# Patient Record
Sex: Female | Born: 1955 | Race: Black or African American | Hispanic: No | Marital: Single | Smoking: Never smoker
Health system: Southern US, Community
[De-identification: ages and names within clinical notes are randomized; demographics above are authoritative.]

## PROBLEM LIST (undated history)

## (undated) DIAGNOSIS — G43909 Migraine, unspecified, not intractable, without status migrainosus: Secondary | ICD-10-CM

## (undated) DIAGNOSIS — F319 Bipolar disorder, unspecified: Secondary | ICD-10-CM

## (undated) DIAGNOSIS — S7291XA Unspecified fracture of right femur, initial encounter for closed fracture: Secondary | ICD-10-CM

## (undated) DIAGNOSIS — Z973 Presence of spectacles and contact lenses: Secondary | ICD-10-CM

## (undated) HISTORY — PX: ROTATOR CUFF REPAIR: SHX139

## (undated) HISTORY — PX: COLONOSCOPY: SHX174

## (undated) HISTORY — PX: ABDOMINAL HYSTERECTOMY: SHX81

---

## 1998-10-21 ENCOUNTER — Encounter: Payer: Self-pay | Admitting: Obstetrics and Gynecology

## 1998-10-21 ENCOUNTER — Ambulatory Visit (HOSPITAL_COMMUNITY): Admission: RE | Admit: 1998-10-21 | Discharge: 1998-10-21 | Payer: Self-pay | Admitting: Obstetrics and Gynecology

## 1999-10-31 ENCOUNTER — Encounter: Payer: Self-pay | Admitting: Obstetrics and Gynecology

## 1999-10-31 ENCOUNTER — Ambulatory Visit (HOSPITAL_COMMUNITY): Admission: RE | Admit: 1999-10-31 | Discharge: 1999-10-31 | Payer: Self-pay | Admitting: Obstetrics and Gynecology

## 1999-11-07 ENCOUNTER — Other Ambulatory Visit: Admission: RE | Admit: 1999-11-07 | Discharge: 1999-11-07 | Payer: Self-pay | Admitting: Obstetrics and Gynecology

## 2001-03-17 ENCOUNTER — Encounter: Payer: Self-pay | Admitting: Internal Medicine

## 2001-03-17 ENCOUNTER — Encounter: Admission: RE | Admit: 2001-03-17 | Discharge: 2001-03-17 | Payer: Self-pay | Admitting: Internal Medicine

## 2001-05-05 ENCOUNTER — Encounter: Payer: Self-pay | Admitting: Obstetrics and Gynecology

## 2001-05-05 ENCOUNTER — Ambulatory Visit (HOSPITAL_COMMUNITY): Admission: RE | Admit: 2001-05-05 | Discharge: 2001-05-05 | Payer: Self-pay | Admitting: Obstetrics and Gynecology

## 2001-10-04 ENCOUNTER — Ambulatory Visit (HOSPITAL_COMMUNITY): Admission: RE | Admit: 2001-10-04 | Discharge: 2001-10-04 | Payer: Self-pay | Admitting: Internal Medicine

## 2001-10-04 ENCOUNTER — Encounter: Payer: Self-pay | Admitting: Internal Medicine

## 2001-11-20 ENCOUNTER — Encounter: Payer: Self-pay | Admitting: Neurosurgery

## 2001-11-20 ENCOUNTER — Inpatient Hospital Stay (HOSPITAL_COMMUNITY): Admission: RE | Admit: 2001-11-20 | Discharge: 2001-11-21 | Payer: Self-pay | Admitting: Neurosurgery

## 2001-12-17 ENCOUNTER — Encounter: Payer: Self-pay | Admitting: Neurosurgery

## 2001-12-17 ENCOUNTER — Encounter: Admission: RE | Admit: 2001-12-17 | Discharge: 2001-12-17 | Payer: Self-pay | Admitting: Neurosurgery

## 2004-11-10 ENCOUNTER — Encounter: Admission: RE | Admit: 2004-11-10 | Discharge: 2004-11-10 | Payer: Self-pay | Admitting: Internal Medicine

## 2004-11-11 ENCOUNTER — Ambulatory Visit (HOSPITAL_COMMUNITY): Admission: AD | Admit: 2004-11-11 | Discharge: 2004-11-12 | Payer: Self-pay | Admitting: Orthopedic Surgery

## 2006-03-07 ENCOUNTER — Other Ambulatory Visit: Admission: RE | Admit: 2006-03-07 | Discharge: 2006-03-07 | Payer: Self-pay | Admitting: Obstetrics and Gynecology

## 2006-04-05 ENCOUNTER — Ambulatory Visit (HOSPITAL_COMMUNITY): Admission: RE | Admit: 2006-04-05 | Discharge: 2006-04-05 | Payer: Self-pay | Admitting: Obstetrics and Gynecology

## 2006-04-18 ENCOUNTER — Encounter: Admission: RE | Admit: 2006-04-18 | Discharge: 2006-04-18 | Payer: Self-pay | Admitting: Obstetrics and Gynecology

## 2006-05-23 ENCOUNTER — Encounter: Admission: RE | Admit: 2006-05-23 | Discharge: 2006-05-23 | Payer: Self-pay | Admitting: Internal Medicine

## 2007-11-26 ENCOUNTER — Emergency Department (HOSPITAL_COMMUNITY): Admission: EM | Admit: 2007-11-26 | Discharge: 2007-11-26 | Payer: Self-pay | Admitting: Emergency Medicine

## 2009-03-06 ENCOUNTER — Emergency Department (HOSPITAL_BASED_OUTPATIENT_CLINIC_OR_DEPARTMENT_OTHER): Admission: EM | Admit: 2009-03-06 | Discharge: 2009-03-06 | Payer: Self-pay | Admitting: Emergency Medicine

## 2010-09-18 ENCOUNTER — Emergency Department (HOSPITAL_COMMUNITY)
Admission: EM | Admit: 2010-09-18 | Discharge: 2010-09-19 | Payer: Self-pay | Source: Home / Self Care | Admitting: Emergency Medicine

## 2010-10-28 ENCOUNTER — Encounter: Payer: Self-pay | Admitting: Internal Medicine

## 2010-10-29 ENCOUNTER — Encounter: Payer: Self-pay | Admitting: Internal Medicine

## 2010-12-19 LAB — ETHANOL: Alcohol, Ethyl (B): <5 mg/dL (ref 0–10)

## 2010-12-19 LAB — COMPREHENSIVE METABOLIC PANEL
ALT: 14 U/L (ref 0–35)
AST: 17 U/L (ref 0–37)
Calcium: 9.2 mg/dL (ref 8.4–10.5)
Creatinine, Ser: 0.75 mg/dL (ref 0.4–1.2)
GFR calc Af Amer: >60 mL/min (ref >60)
GFR calc non Af Amer: >60 mL/min (ref >60)
Sodium: 139 mEq/L (ref 135–145)
Total Protein: 7.1 g/dL (ref 6.0–8.3)

## 2010-12-19 LAB — URINALYSIS, ROUTINE W REFLEX MICROSCOPIC
Bilirubin Urine: NEGATIVE
Glucose, UA: NEGATIVE mg/dL
Ketones, ur: NEGATIVE mg/dL
Protein, ur: NEGATIVE mg/dL
Urobilinogen, UA: 0.2 mg/dL (ref 0.0–1.0)

## 2010-12-19 LAB — CBC
MCH: 30.9 pg (ref 26.0–34.0)
MCHC: 34.3 g/dL (ref 30.0–36.0)
Platelets: 254 10*3/uL (ref 150–400)
RDW: 13 % (ref 11.5–15.5)

## 2010-12-19 LAB — DIFFERENTIAL
Eosinophils Absolute: 0.1 10*3/uL (ref 0.0–0.7)
Eosinophils Relative: 2 % (ref 0–5)
Lymphocytes Relative: 59 % — ABNORMAL HIGH (ref 12–46)
Lymphs Abs: 3.4 10*3/uL (ref 0.7–4.0)
Monocytes Relative: 6 % (ref 3–12)

## 2010-12-19 LAB — RAPID URINE DRUG SCREEN, HOSP PERFORMED
Amphetamines: NOT DETECTED
Benzodiazepines: NOT DETECTED
Cocaine: NOT DETECTED
Opiates: NOT DETECTED
Tetrahydrocannabinol: POSITIVE — AB

## 2010-12-19 LAB — URINE MICROSCOPIC-ADD ON

## 2011-01-16 LAB — COMPREHENSIVE METABOLIC PANEL
ALT: 16 U/L (ref 0–35)
AST: 25 U/L (ref 0–37)
Albumin: 4.6 g/dL (ref 3.5–5.2)
Alkaline Phosphatase: 85 U/L (ref 39–117)
BUN: 11 mg/dL (ref 6–23)
CO2: 27 mEq/L (ref 19–32)
Calcium: 9.6 mg/dL (ref 8.4–10.5)
Chloride: 108 mEq/L (ref 96–112)
Creatinine, Ser: 0.9 mg/dL (ref 0.4–1.2)
GFR calc Af Amer: >60 mL/min (ref >60)
GFR calc non Af Amer: >60 mL/min (ref >60)
Glucose, Bld: 83 mg/dL (ref 70–99)
Potassium: 3.9 mEq/L (ref 3.5–5.1)
Sodium: 144 mEq/L (ref 135–145)
Total Bilirubin: 0.2 mg/dL — ABNORMAL LOW (ref 0.3–1.2)
Total Protein: 8.1 g/dL (ref 6.0–8.3)

## 2011-01-16 LAB — CBC
HCT: 38.1 % (ref 36.0–46.0)
Hemoglobin: 12.8 g/dL (ref 12.0–15.0)
MCHC: 33.7 g/dL (ref 30.0–36.0)
MCV: 95.1 fL (ref 78.0–100.0)
Platelets: 230 10*3/uL (ref 150–400)
RBC: 4 MIL/uL (ref 3.87–5.11)
RDW: 12.3 % (ref 11.5–15.5)
WBC: 6 10*3/uL (ref 4.0–10.5)

## 2011-01-16 LAB — DIFFERENTIAL
Basophils Absolute: 0.1 10*3/uL (ref 0.0–0.1)
Basophils Relative: 2 % — ABNORMAL HIGH (ref 0–1)
Eosinophils Absolute: 0.1 10*3/uL (ref 0.0–0.7)
Eosinophils Relative: 1 % (ref 0–5)
Lymphocytes Relative: 36 % (ref 12–46)
Lymphs Abs: 2.1 10*3/uL (ref 0.7–4.0)
Monocytes Absolute: 0.3 10*3/uL (ref 0.1–1.0)
Monocytes Relative: 5 % (ref 3–12)
Neutro Abs: 3.4 10*3/uL (ref 1.7–7.7)
Neutrophils Relative %: 56 % (ref 43–77)

## 2011-01-16 LAB — POCT TOXICOLOGY PANEL
Barbiturates: POSITIVE
Tetrahydrocannabinol: POSITIVE

## 2011-01-16 LAB — ACETAMINOPHEN LEVEL: Acetaminophen (Tylenol), Serum: <10 ug/mL — ABNORMAL LOW (ref 10–30)

## 2011-01-16 LAB — SALICYLATE LEVEL: Salicylate Lvl: 1 mg/dL — ABNORMAL LOW (ref 2.8–20.0)

## 2011-01-16 LAB — ETHANOL: Alcohol, Ethyl (B): <5 mg/dL (ref 0–10)

## 2011-02-23 NOTE — Discharge Summary (Signed)
[place]. [facility]  Patient:    [name], Sandra Warren Visit Number: [id number] MRN: [id number]          Service Type: SUR Location: 3000 3022 01 Attending Physician:  Jackelyn Knife Dictated by:   Izell [place] Elesa Hacker, M.D. Admit Date:  11/20/2001 Discharge Date: 11/21/2001   CC:         Antony Madura, M.D.   Discharge Summary  SUMMARY:  Mrs. Sandra Warren is a generally healthy 55 year old lady who presented with signs and symptoms of a herniated intervertebral disk in her lower cervical area at C6-7 associated with left arm pain and weakness.  This diagnosis was confirmed on MRI.  She was followed conservatively for awhile but did not improve and was admitted on 11/20/01 for surgical decompression.  On the day of admission, she was taken to the operating room and underwent an anterior cervical diskectomy and fusion at C6-7.  This was done with autograft bone and the BAK/C cage.  She tolerated the procedure well and was able to be discharged the following day.  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  CONDITION AT DISCHARGE:  Improved.  Her incision was healing well and her arm pain was much better.  INSTRUCTIONS TO PATIENT: 1. Limited activity with 10-pound weight lifting restriction for four weeks. 2. Regular diet. 3. Back to see me in about 10 days. 4. Discharge medications: Vicodin. 5. She is to wear a soft cervical collar at all times when up and also at    night. Dictated by:   Izell [place] Elesa Hacker, M.D. Attending Physician:  Jackelyn Knife DD:  11/21/01 TD:  11/21/01 Job: [id number]

## 2011-02-23 NOTE — Op Note (Signed)
[place]. [facility]  Patient:    Sandra Warren, Sandra Warren Visit Number: [id number] MRN: [id number]          Service Type: SUR Location: 3000 3022 01 Attending Physician:  Jackelyn Knife Dictated by:   Izell [place] Elesa Hacker, M.D. Proc. Date: 11/20/01 Admit Date:  11/20/2001 Discharge Date: 11/21/2001                             Operative Report  PREOPERATIVE DIAGNOSIS:  Herniated cervical disk, C6-7.  POSTOPERATIVE DIAGNOSIS:  Herniated cervical disk, C6-7.  OPERATION PERFORMED:  Anterior cervical diskectomy utilizing the BAK-C intervertebral cage with autograft.  SURGEON:  Izell [place]. Elesa Hacker, M.D.  ASSISTANT:  Clydene Fake, M.D.  DESCRIPTION OF PROCEDURE:  Under general endotracheal anesthesia, the patient was supine on the operating room table with the head held in halter traction. The patient was prepped and draped in the usual manner, and an incision was made on the left side of the neck extending from the midline, obliquely to the anterior border of the sternocleidomastoid, situated in such a way as to allow access to the C6-7 intervertebral space.  Soft tissue dissection was carried down to the anterior vertebral border, and an intraoperative x-ray was taken to positively identify the proper operative level.  Self-retaining retractors were put into place, and the C6-7 disk was excised by means of removal of the anterior portion of the annulus, and then removal of soft disk tissue using curettes, rongeurs, and eventually the high speed drill.  The operating microscope was utilized for this part of the procedure.  Once the interspace was clear and all of the offending herniated disk material had been removed, the BAK-C cage instrumentation was brought into place.  The interspace was properly sized and prepared with the instrumentation.  A cage was then screwed into place under fluoroscopic control.  After proper placement of the cage was confirmed  radiographically, the wound was irrigated and then closed in layers.  A sterile dressing was applied, as well as a soft collar, and the patient was taken to the recovery room in good condition. Dictated by:   Izell [place] Elesa Hacker, M.D. Attending Physician:  Jackelyn Knife DD:  12/04/01 TD:  12/04/01 Job: [id number]

## 2011-02-23 NOTE — Op Note (Signed)
Sandra Warren, Sandra Warren              ACCOUNT NO.:  [id number]   MEDICAL RECORD NO.:  [id number]          PATIENT TYPE:  OIB   LOCATION:  2899                         FACILITY:  [facility]   PHYSICIAN:  John L. Rendall III, M.D.DATE OF BIRTH:  1955-12-23   DATE OF PROCEDURE:  DATE OF DISCHARGE:                                 OPERATIVE REPORT   DATE OF OPERATION:  November 11, 2004.   PREOPERATIVE DIAGNOSIS:  Totally displaced two-part surgical neck fracture,  right humerus.   POSTOPERATIVE DIAGNOSIS:  Totally displaced two-part surgical neck fracture,  right humerus.   SURGICAL PROCEDURE:  Closed manipulation and percutaneous pinning, two-part  surgical neck fracture.   SURGEON:  John L. Rendall, MD.   ASSISTANTRexene Edison, PA-C.   ANESTHESIA:  General.   PATHOLOGY:  The patient is three days post fracture of the surgical neck of  the humerus with the shaft displaced anteriorly into the axillary region.  She just came to our office 12 hours earlier, and the fracture was scheduled  for manipulation and procedure.   PROCEDURE:  Under general anesthesia, the right shoulder was manipulated  with a blanket in the armpit and distal traction in an abducted forward  flexed position.  Once disimpaction had occurred, the humeral neck of the  distal fragment was brought out of the axillary region, and the bones could  be felt to contact each other.  AP views revealed what appeared to be good  position.  The shoulder was rotated slightly and some malalignment was felt  to be present.  Decision was made to do another sort of mini-manipulation,  and excellent position and alignment were obtained.  Using 2.5 mm distally  threaded Synthes K-wires, two wires were placed from the anterior humeral  neck up into the humeral head, and two were placed from the humeral into the  medial shaft.  With the C-arm to confirm good position on 90 degree internal  and external rotation pictures, the case was  over.  The Steinmann pins were  bent just an inch from the skin and pin caps applied after cutting the  wires.  A sterile dressing and arm sling were applied.  The patient returned  to recovery in good condition.  Surgical time 30 minutes.      JLR/MEDQ  D:  11/11/2004  T:  11/12/2004  Job:  [id number]

## 2011-02-23 NOTE — H&P (Signed)
[place]. [facility]  Patient:    Sandra Warren, Sandra Warren Visit Number: [id number] MRN: [id number]          Service Type: SUR Location: 3000 3022 01 Attending Physician:  Jackelyn Knife Dictated by:   Izell [place] Elesa Hacker, M.D. Admit Date:  11/20/2001                           History and Physical  HISTORY OF PRESENT ILLNESS:  Sandra Warren is a 55 year old lady who was seen initially on October 06, 2001 through the courtesy of Dr. Antony Warren. At that time, she had a chief complaint of pain in the upper portion of the back on the left side and had noted radiation of pain across her left scapula and on down into the left arm.  Sandra Warren felt that this started on October 02, 2001 when she fell at home and ended up falling down 14 carpeted steps when she tripped over her cap.  She is left-handed and this has increased the amount of difficulty she has had.  She has had no trouble with her legs, bowel, or bladder control.  She was seen October 06, 2001, and at that time, had triceps weakness on the left side, along with some minimal sensory deficits over C7.  At the time of the initial visit, a careful discussion was carried out with her and her options were noted to include surgical treatment by means of an anterior cervical discectomy fusion or a conservative follow-up at least over the next few weeks.  She elected to go conservatively and this was done.  By late January, she was no better and indeed seemed to be getting worse, so plans were made for surgical treatment by means of an anterior cervical discectomy fusion.  PAST MEDICAL HISTORY:  Pertinent in that she is medically retired for psychiatric reasons and she tells me that she has anxiety and panic attacks. She is currently under treatment for this.  FAMILY HISTORY:  Mother living at age 32 and her father 80, living and well. She has a 59 year old sister living and well.  She has no  children.  PAST SURGICAL HISTORY:  She had a hysterectomy in 1993.  CURRENT MEDICATIONS:  She is currently taking no medication.  ALLERGIES:  No medical allergies.  SOCIAL HISTORY:  She does not smoke or use alcohol.  She is medically retired as noted.  REVIEW OF SYSTEMS:  Negative.  PHYSICAL EXAMINATION:  GENERAL:  She is alert and cooperative.  Her affect is somewhat different and she occasionally has some difficulty concentrating on the issues at hand.  She is oriented to person, place, and time.  HEENT:  Normal.  CHEST:  Clear.  CARDIOVASCULAR:  No bruits.  BREASTS:  A detailed breast examination was not done.  ABDOMEN:  Soft and nontender with no palpable masses.  NEUROLOGICAL:  Sensorium as noted.  The gait is probably normal but she does move very cautiously and slowly and holds her head and neck completely stiff and also does not wish to move her left arm.  Motor examination reveals definite and reproducible weakness of the left triceps.  There is diminished sensation of the index finger primarily of her left hand and some slight involvement over the third finger.  Right-sided strength and sensation are normal.  Deep tendon reflexes are 1 to 2+ and symmetrical, except for a clearly diminished left triceps reflex.  Her pain  could be worsened with rotation of the head and neck to the left and with hyperextension.  IMPRESSION:  Herniated cervical disc at cervical vertebrae-6 and cervical vertebrae-7, central and on the left side.  RECOMMENDATIONS:  Recommendation for surgical decompression by means of an anterior cervical discectomy fusion.  She understands the risks including the risks of damage to the carotid artery, trachea, and esophagus.  She understands there could be damage to nerve root and spinal cord with the result of loss of motor and sensory function.  She understands that there is the risks of bleeding or infection as there is with any  surgery. Dictated by:   Izell [place] Elesa Hacker, M.D. Attending Physician:  Jackelyn Knife DD:  11/19/01 TD:  11/19/01 Job: [id number]

## 2011-07-02 LAB — URINALYSIS, ROUTINE W REFLEX MICROSCOPIC
Bilirubin Urine: NEGATIVE
Glucose, UA: NEGATIVE
Ketones, ur: NEGATIVE
Leukocytes, UA: NEGATIVE
Nitrite: NEGATIVE
Protein, ur: 100 — AB
Specific Gravity, Urine: 1.024
Urobilinogen, UA: 0.2
pH: 5

## 2011-07-02 LAB — CBC
HCT: 38
Hemoglobin: 12.7
MCHC: 33.5
MCV: 93.9
Platelets: 211
RBC: 4.05
RDW: 12.8
WBC: 7.5

## 2011-07-02 LAB — URINE MICROSCOPIC-ADD ON

## 2011-07-02 LAB — DIFFERENTIAL
Basophils Absolute: 0
Basophils Relative: 0
Eosinophils Absolute: 0
Eosinophils Relative: 0
Lymphocytes Relative: 15
Lymphs Abs: 1.1
Monocytes Absolute: 0.3
Monocytes Relative: 4
Neutro Abs: 6
Neutrophils Relative %: 81 — ABNORMAL HIGH

## 2011-07-02 LAB — COMPREHENSIVE METABOLIC PANEL
ALT: 20
AST: 23
Albumin: 4.1
Alkaline Phosphatase: 55
BUN: 8
CO2: 23
Calcium: 9
Chloride: 108
Creatinine, Ser: 0.68
GFR calc Af Amer: >60
GFR calc non Af Amer: >60
Glucose, Bld: 83
Potassium: 3.6
Sodium: 138
Total Bilirubin: 0.3
Total Protein: 7.2

## 2011-07-02 LAB — URINE CULTURE
Colony Count: NO GROWTH
Culture: NO GROWTH

## 2011-07-02 LAB — RAPID URINE DRUG SCREEN, HOSP PERFORMED
Amphetamines: NOT DETECTED
Barbiturates: POSITIVE — AB
Benzodiazepines: NOT DETECTED
Cocaine: NOT DETECTED
Opiates: NOT DETECTED
Tetrahydrocannabinol: POSITIVE — AB

## 2011-07-02 LAB — ETHANOL: Alcohol, Ethyl (B): 5

## 2015-10-20 ENCOUNTER — Other Ambulatory Visit: Payer: Self-pay | Admitting: Internal Medicine

## 2015-10-20 ENCOUNTER — Ambulatory Visit
Admission: RE | Admit: 2015-10-20 | Discharge: 2015-10-20 | Disposition: A | Discharge status: Home / Self Care | Payer: Medicare Other | Source: Ambulatory Visit | Attending: Internal Medicine | Admitting: Internal Medicine

## 2015-10-20 DIAGNOSIS — R52 Pain, unspecified: Secondary | ICD-10-CM

## 2016-09-19 ENCOUNTER — Ambulatory Visit
Admission: RE | Admit: 2016-09-19 | Discharge: 2016-09-19 | Disposition: A | Discharge status: Home / Self Care | Payer: Medicare Other | Source: Ambulatory Visit | Attending: Internal Medicine | Admitting: Internal Medicine

## 2016-09-19 ENCOUNTER — Other Ambulatory Visit: Payer: Self-pay | Admitting: Internal Medicine

## 2016-09-19 DIAGNOSIS — T1490XA Injury, unspecified, initial encounter: Secondary | ICD-10-CM

## 2017-10-15 ENCOUNTER — Other Ambulatory Visit: Payer: Self-pay | Admitting: Internal Medicine

## 2017-10-15 DIAGNOSIS — R410 Disorientation, unspecified: Secondary | ICD-10-CM

## 2017-10-15 DIAGNOSIS — R413 Other amnesia: Secondary | ICD-10-CM

## 2017-10-15 DIAGNOSIS — S0990XA Unspecified injury of head, initial encounter: Secondary | ICD-10-CM

## 2017-10-18 ENCOUNTER — Ambulatory Visit
Admission: RE | Admit: 2017-10-18 | Discharge: 2017-10-18 | Disposition: A | Discharge status: Home / Self Care | Payer: Medicare Other | Source: Ambulatory Visit | Attending: Internal Medicine | Admitting: Internal Medicine

## 2017-10-18 DIAGNOSIS — R413 Other amnesia: Secondary | ICD-10-CM

## 2017-10-18 DIAGNOSIS — R410 Disorientation, unspecified: Secondary | ICD-10-CM

## 2017-10-18 DIAGNOSIS — S0990XA Unspecified injury of head, initial encounter: Secondary | ICD-10-CM

## 2017-11-12 ENCOUNTER — Other Ambulatory Visit: Payer: Self-pay | Admitting: Internal Medicine

## 2017-11-12 ENCOUNTER — Ambulatory Visit
Admission: RE | Admit: 2017-11-12 | Discharge: 2017-11-12 | Disposition: A | Discharge status: Home / Self Care | Payer: Medicare Other | Source: Ambulatory Visit | Attending: Internal Medicine | Admitting: Internal Medicine

## 2017-11-12 DIAGNOSIS — S93402A Sprain of unspecified ligament of left ankle, initial encounter: Secondary | ICD-10-CM

## 2019-06-16 ENCOUNTER — Emergency Department (HOSPITAL_COMMUNITY)
Admission: EM | Admit: 2019-06-16 | Discharge: 2019-06-16 | Discharge status: Against Medical Advice | Payer: Medicare Other | Attending: Emergency Medicine | Admitting: Emergency Medicine

## 2019-06-16 ENCOUNTER — Other Ambulatory Visit: Payer: Self-pay

## 2019-06-16 ENCOUNTER — Encounter (HOSPITAL_COMMUNITY): Payer: Self-pay

## 2019-06-16 DIAGNOSIS — Z5321 Procedure and treatment not carried out due to patient leaving prior to being seen by health care provider: Secondary | ICD-10-CM | POA: Diagnosis not present

## 2019-06-16 DIAGNOSIS — R51 Headache: Secondary | ICD-10-CM | POA: Diagnosis present

## 2019-06-16 HISTORY — DX: Migraine, unspecified, not intractable, without status migrainosus: G43.909

## 2019-06-16 NOTE — ED Triage Notes (Signed)
Patient states she hit her head on a door jamb 3 months ago and states she slipped down 18 steps 2 months ago. Patient states she began feeling like pins sticking her in her head the day she slipped down the stairs. Patient states she feels like something is not right in her head. Patient states, "I feel like I need an MRI."

## 2020-01-28 ENCOUNTER — Encounter (HOSPITAL_COMMUNITY): Payer: Self-pay | Admitting: Emergency Medicine

## 2020-01-28 ENCOUNTER — Other Ambulatory Visit: Payer: Self-pay

## 2020-01-28 ENCOUNTER — Emergency Department (HOSPITAL_COMMUNITY)
Admission: EM | Admit: 2020-01-28 | Discharge: 2020-01-29 | Disposition: A | Discharge status: Home / Self Care | Payer: Medicare Other | Attending: Emergency Medicine | Admitting: Emergency Medicine

## 2020-01-28 DIAGNOSIS — Z5321 Procedure and treatment not carried out due to patient leaving prior to being seen by health care provider: Secondary | ICD-10-CM | POA: Insufficient documentation

## 2020-01-28 DIAGNOSIS — R519 Headache, unspecified: Secondary | ICD-10-CM | POA: Insufficient documentation

## 2020-01-28 LAB — COMPREHENSIVE METABOLIC PANEL
ALT: 10 U/L (ref 0–44)
AST: 13 U/L — ABNORMAL LOW (ref 15–41)
Albumin: 3.7 g/dL (ref 3.5–5.0)
Alkaline Phosphatase: 54 U/L (ref 38–126)
Anion gap: 8 (ref 5–15)
BUN: 6 mg/dL — ABNORMAL LOW (ref 8–23)
CO2: 22 mmol/L (ref 22–32)
Calcium: 8.7 mg/dL — ABNORMAL LOW (ref 8.9–10.3)
Chloride: 107 mmol/L (ref 98–111)
Creatinine, Ser: 0.71 mg/dL (ref 0.44–1.00)
GFR calc Af Amer: >60 mL/min (ref >60)
GFR calc non Af Amer: >60 mL/min (ref >60)
Glucose, Bld: 84 mg/dL (ref 70–99)
Potassium: 3.4 mmol/L — ABNORMAL LOW (ref 3.5–5.1)
Sodium: 137 mmol/L (ref 135–145)
Total Bilirubin: 0.3 mg/dL (ref 0.3–1.2)
Total Protein: 6.7 g/dL (ref 6.5–8.1)

## 2020-01-28 LAB — CBC WITH DIFFERENTIAL/PLATELET
Abs Immature Granulocytes: 0.02 10*3/uL (ref 0.00–0.07)
Basophils Absolute: 0 10*3/uL (ref 0.0–0.1)
Basophils Relative: 1 %
Eosinophils Absolute: 0.1 10*3/uL (ref 0.0–0.5)
Eosinophils Relative: 2 %
HCT: 37.3 % (ref 36.0–46.0)
Hemoglobin: 12.4 g/dL (ref 12.0–15.0)
Immature Granulocytes: 1 %
Lymphocytes Relative: 52 %
Lymphs Abs: 2.1 10*3/uL (ref 0.7–4.0)
MCH: 31.2 pg (ref 26.0–34.0)
MCHC: 33.2 g/dL (ref 30.0–36.0)
MCV: 93.7 fL (ref 80.0–100.0)
Monocytes Absolute: 0.4 10*3/uL (ref 0.1–1.0)
Monocytes Relative: 9 %
Neutro Abs: 1.4 10*3/uL — ABNORMAL LOW (ref 1.7–7.7)
Neutrophils Relative %: 35 %
Platelets: 245 10*3/uL (ref 150–400)
RBC: 3.98 MIL/uL (ref 3.87–5.11)
RDW: 12.5 % (ref 11.5–15.5)
WBC: 4 10*3/uL (ref 4.0–10.5)
nRBC: 0 % (ref 0.0–0.2)

## 2020-01-28 NOTE — ED Triage Notes (Addendum)
Pt st's she slipped and fell down 13 steps 3 weeks ago then she slipped in oil and fell again.  Pt denies head pain but st's she has bumps on her head.  Pt st's her head just doesn't feel right

## 2020-01-28 NOTE — ED Notes (Signed)
Pt stated to take her out of the system because she would be leaving.

## 2020-03-22 ENCOUNTER — Other Ambulatory Visit: Payer: Self-pay

## 2020-03-22 ENCOUNTER — Other Ambulatory Visit: Payer: Self-pay | Admitting: Orthopedic Surgery

## 2020-03-22 ENCOUNTER — Ambulatory Visit
Admission: RE | Admit: 2020-03-22 | Discharge: 2020-03-22 | Disposition: A | Discharge status: Home / Self Care | Payer: Medicare Other | Source: Ambulatory Visit | Attending: Orthopedic Surgery | Admitting: Orthopedic Surgery

## 2020-03-22 ENCOUNTER — Encounter (HOSPITAL_COMMUNITY): Payer: Self-pay | Admitting: Student

## 2020-03-22 ENCOUNTER — Ambulatory Visit: Payer: Self-pay | Admitting: Student

## 2020-03-22 DIAGNOSIS — M25561 Pain in right knee: Secondary | ICD-10-CM

## 2020-03-22 DIAGNOSIS — S72461A Displaced supracondylar fracture with intracondylar extension of lower end of right femur, initial encounter for closed fracture: Secondary | ICD-10-CM | POA: Insufficient documentation

## 2020-03-22 NOTE — Progress Notes (Signed)
Pt denies SOB, chest pain, and being under the care of a cardiologist. Pt stated that PCP is Dr. Su Hilt. Pt denies having a stress test, echo and cardiac cath. Pt denies having an EKG and chest x ray. Pt denies recent labs. Pt made aware to stop taking Aspirin (unless otherwise advised by surgeon), vitamins, fish oil and herbal medications. Do not take any NSAIDs ie: Ibuprofen, Advil, Naproxen (Aleve), Motrin, BC and Goody Powder. Pt reminded to quarantine. Pt verbalized understanding of all pre-op instructions.

## 2020-03-22 NOTE — H&P (Signed)
Orthopaedic Trauma Service (OTS) H&P  Patient ID: Sandra Warren MRN: [id number] DOB/AGE: 12-Nov-1955 64 y.o.  Reason for surgery: Right distal femur fracture  HPI: Sandra Warren is an 64 y.o. female with no significant past medical history who presents for surgery on her right lower extremity.  Patient slipped and fell in her kitchen on 03/15/2020.  She had immediate pain and swelling in her right knee.  She did not seek immediate medical attention at the time of initial injury.  1 week after initial injury patient symptoms were not improving and she was still unable to weight-bear on the right lower extremity.  At this point she was evaluated at [facility] orthopedics.  Imaging revealed a displaced right distal femur fracture.  Patient was referred to orthopedic trauma service to discuss surgical management.  Patient last seen in OTS clinic on 03/22/2020.  Continues have significant pain and swelling of the right knee.  Significant pain with any range of motion of the knee.  Denies any other injuries sustained during her fall.  Denies any significant numbness or tingling through the right lower extremity.  Denies any previous injury or surgery to the right lower extremity.  Patient with no other medical issues.  On no anticoagulation.  Patient very active at baseline and ambulates without any assistive device.  Patient does not currently work.  Home independently.  Has no steps to enter her home  Past Medical History:  Diagnosis Date  . Femur fracture, right (HCC)    distal  . Migraine     Past Surgical History:  Procedure Laterality Date  . ABDOMINAL HYSTERECTOMY    . ROTATOR CUFF REPAIR      Family History  Problem Relation Age of Onset  . Hypertension Mother   . Hypertension Father   . Hypertension Sister     Social History:  reports that she has never smoked. She has never used smokeless tobacco. She reports that she does not drink alcohol and does not use  drugs.  Allergies:  Allergies  Allergen Reactions  . Cheese   . Peanut-Containing Drug Products     Makes pt feel weird     Medications:  Prior to Admission:  No medications prior to admission.    ROS: Constitutional: No fever or chills Vision: No changes in vision ENT: No difficulty swallowing CV: No chest pain Pulm: No SOB or wheezing GI: No nausea or vomiting GU: No urgency or inability to hold urine Skin: No poor wound healing Neurologic: No numbness or tingling Psychiatric: No depression or anxiety Heme: No bruising Allergic: No reaction to medications or food   Exam: There were no vitals taken for this visit. General: Thin female, appears her stated age.  No acute distress Orientation: Alert and oriented x3 Mood and Affect: Mood and affect appropriate.  Pleasant and cooperative Gait: Not assessed due to known fracture Coordination and balance: Within normal limits  Right lower extremity: Skin with mild ecchymosis over the knee.  Moderate knee effusion.  Tenderness with palpation over the distal femur.  Nontender to the lower leg, ankle, foot.  Ankle dorsiflexion and plantarflexion is intact.  Knee motion not tolerated secondary to pain.  Endorses sensation to light touch distally.  Compartments are soft and compressible.  Able to wiggle each of her toes.  2+ DP pulse  Left lower extremity: Skin without lesions. No tenderness to palpation. Full painless ROM, full strength in each muscle groups without evidence of instability.   Medical Decision  Making: Data: Imaging: X-ray and CT scan of right knee reveals comminuted, displaced distal femur fracture  Labs: No results found for this or any previous visit (from the past 168 hour(s)).    Assessment/Plan: 64 year old female status post fall, resulting in right displaced distal femur fracture.  Patient with significant injury to right knee that will require surgical fixation.  Without surgical intervention patient  is likely to go on to have significant posttraumatic arthritis of the right knee.  Patient is an extremely active individual and therefore I recommend proceeding with open reduction internal fixation of the right distal femur.  Operative versus nonoperative management of her injury including risks and benefits of each were discussed with the patient and her family members.  Patient would like to proceed with surgical fixation.  Consent will be obtained.  We will plan to admit patient postoperatively for observation, pain control, and for patient to mobilize with therapies.  Karsyn Rochin A. [facility] [phone] (office) [email or web address]

## 2020-03-23 ENCOUNTER — Observation Stay (HOSPITAL_COMMUNITY): Payer: Medicare Other

## 2020-03-23 ENCOUNTER — Encounter (HOSPITAL_COMMUNITY): Payer: Self-pay | Admitting: Student

## 2020-03-23 ENCOUNTER — Inpatient Hospital Stay (HOSPITAL_COMMUNITY): Payer: Medicare Other | Admitting: Certified Registered Nurse Anesthetist

## 2020-03-23 ENCOUNTER — Encounter (HOSPITAL_COMMUNITY)
Admission: RE | Disposition: A | Discharge status: Home / Self Care | Payer: Self-pay | Source: Home / Self Care | Attending: Student

## 2020-03-23 ENCOUNTER — Inpatient Hospital Stay (HOSPITAL_COMMUNITY): Payer: Medicare Other

## 2020-03-23 ENCOUNTER — Other Ambulatory Visit: Payer: Self-pay

## 2020-03-23 ENCOUNTER — Observation Stay (HOSPITAL_COMMUNITY)
Admission: RE | Admit: 2020-03-23 | Discharge: 2020-03-24 | Disposition: A | Discharge status: Home / Self Care | Payer: Medicare Other | Attending: Student | Admitting: Student

## 2020-03-23 DIAGNOSIS — R2681 Unsteadiness on feet: Secondary | ICD-10-CM | POA: Insufficient documentation

## 2020-03-23 DIAGNOSIS — Z20822 Contact with and (suspected) exposure to covid-19: Secondary | ICD-10-CM | POA: Diagnosis not present

## 2020-03-23 DIAGNOSIS — Z9181 History of falling: Secondary | ICD-10-CM | POA: Insufficient documentation

## 2020-03-23 DIAGNOSIS — S72401A Unspecified fracture of lower end of right femur, initial encounter for closed fracture: Secondary | ICD-10-CM | POA: Diagnosis present

## 2020-03-23 DIAGNOSIS — S72461A Displaced supracondylar fracture with intracondylar extension of lower end of right femur, initial encounter for closed fracture: Principal | ICD-10-CM | POA: Insufficient documentation

## 2020-03-23 DIAGNOSIS — Y92010 Kitchen of single-family (private) house as the place of occurrence of the external cause: Secondary | ICD-10-CM | POA: Diagnosis not present

## 2020-03-23 DIAGNOSIS — W1830XA Fall on same level, unspecified, initial encounter: Secondary | ICD-10-CM | POA: Diagnosis not present

## 2020-03-23 DIAGNOSIS — Z419 Encounter for procedure for purposes other than remedying health state, unspecified: Secondary | ICD-10-CM

## 2020-03-23 DIAGNOSIS — Z91010 Allergy to peanuts: Secondary | ICD-10-CM | POA: Insufficient documentation

## 2020-03-23 DIAGNOSIS — T148XXA Other injury of unspecified body region, initial encounter: Secondary | ICD-10-CM

## 2020-03-23 HISTORY — DX: Bipolar disorder, unspecified: F31.9

## 2020-03-23 HISTORY — DX: Presence of spectacles and contact lenses: Z97.3

## 2020-03-23 HISTORY — DX: Unspecified fracture of right femur, initial encounter for closed fracture: S72.91XA

## 2020-03-23 HISTORY — PX: ORIF FEMUR FRACTURE: SHX2119

## 2020-03-23 LAB — CBC
HCT: 26 % — ABNORMAL LOW (ref 36.0–46.0)
Hemoglobin: 8.7 g/dL — ABNORMAL LOW (ref 12.0–15.0)
MCH: 31.6 pg (ref 26.0–34.0)
MCHC: 33.5 g/dL (ref 30.0–36.0)
MCV: 94.5 fL (ref 80.0–100.0)
Platelets: 370 10*3/uL (ref 150–400)
RBC: 2.75 MIL/uL — ABNORMAL LOW (ref 3.87–5.11)
RDW: 13.1 % (ref 11.5–15.5)
WBC: 7.3 10*3/uL (ref 4.0–10.5)
nRBC: 0 % (ref 0.0–0.2)

## 2020-03-23 LAB — VITAMIN D 25 HYDROXY (VIT D DEFICIENCY, FRACTURES): Vit D, 25-Hydroxy: 13.54 ng/mL — ABNORMAL LOW (ref 30–100)

## 2020-03-23 LAB — BASIC METABOLIC PANEL
Anion gap: 11 (ref 5–15)
BUN: 6 mg/dL — ABNORMAL LOW (ref 8–23)
CO2: 27 mmol/L (ref 22–32)
Calcium: 9 mg/dL (ref 8.9–10.3)
Chloride: 103 mmol/L (ref 98–111)
Creatinine, Ser: 0.72 mg/dL (ref 0.44–1.00)
GFR calc Af Amer: >60 mL/min (ref >60)
GFR calc non Af Amer: >60 mL/min (ref >60)
Glucose, Bld: 98 mg/dL (ref 70–99)
Potassium: 3.1 mmol/L — ABNORMAL LOW (ref 3.5–5.1)
Sodium: 141 mmol/L (ref 135–145)

## 2020-03-23 LAB — SURGICAL PCR SCREEN
MRSA, PCR: NEGATIVE
Staphylococcus aureus: NEGATIVE

## 2020-03-23 LAB — MAGNESIUM: Magnesium: 1.9 mg/dL (ref 1.7–2.4)

## 2020-03-23 LAB — PHOSPHORUS: Phosphorus: 3.1 mg/dL (ref 2.5–4.6)

## 2020-03-23 LAB — SARS CORONAVIRUS 2 BY RT PCR (HOSPITAL ORDER, PERFORMED IN ~~LOC~~ HOSPITAL LAB): SARS Coronavirus 2: NEGATIVE

## 2020-03-23 SURGERY — OPEN REDUCTION INTERNAL FIXATION (ORIF) DISTAL FEMUR FRACTURE
Anesthesia: General | Laterality: Right

## 2020-03-23 MED ORDER — ACETAMINOPHEN 500 MG PO TABS
1000.0000 mg | ORAL_TABLET | Freq: Four times a day (QID) | ORAL | Status: DC
  Administered 2020-03-23 – 2020-03-24 (×4): 1000 mg via ORAL
  Filled 2020-03-23 (×4): qty 2

## 2020-03-23 MED ORDER — FENTANYL CITRATE (PF) 100 MCG/2ML IJ SOLN
INTRAMUSCULAR | Status: AC
  Filled 2020-03-23: qty 2

## 2020-03-23 MED ORDER — FENTANYL CITRATE (PF) 100 MCG/2ML IJ SOLN
25.0000 ug | INTRAMUSCULAR | Status: DC | PRN
  Administered 2020-03-23 (×2): 50 ug via INTRAVENOUS

## 2020-03-23 MED ORDER — MIDAZOLAM HCL 2 MG/2ML IJ SOLN
INTRAMUSCULAR | Status: AC
  Filled 2020-03-23: qty 2

## 2020-03-23 MED ORDER — LORAZEPAM 2 MG/ML IJ SOLN: 1.0000 mg | INTRAMUSCULAR | Status: DC | PRN

## 2020-03-23 MED ORDER — ONDANSETRON HCL 4 MG/2ML IJ SOLN: 4.0000 mg | Freq: Four times a day (QID) | INTRAMUSCULAR | Status: DC | PRN

## 2020-03-23 MED ORDER — FENTANYL CITRATE (PF) 250 MCG/5ML IJ SOLN
INTRAMUSCULAR | Status: AC
  Filled 2020-03-23: qty 5

## 2020-03-23 MED ORDER — ONDANSETRON HCL 4 MG/2ML IJ SOLN
INTRAMUSCULAR | Status: DC | PRN
  Administered 2020-03-23: 4 mg via INTRAVENOUS

## 2020-03-23 MED ORDER — KETAMINE HCL 50 MG/5ML IJ SOSY
PREFILLED_SYRINGE | INTRAMUSCULAR | Status: AC
  Filled 2020-03-23: qty 5

## 2020-03-23 MED ORDER — 0.9 % SODIUM CHLORIDE (POUR BTL) OPTIME
TOPICAL | Status: DC | PRN
  Administered 2020-03-23: 1000 mL

## 2020-03-23 MED ORDER — FENTANYL CITRATE (PF) 250 MCG/5ML IJ SOLN
INTRAMUSCULAR | Status: DC | PRN
  Administered 2020-03-23 (×4): 50 ug via INTRAVENOUS
  Administered 2020-03-23: 100 ug via INTRAVENOUS
  Administered 2020-03-23 (×2): 50 ug via INTRAVENOUS
  Administered 2020-03-23: 100 ug via INTRAVENOUS

## 2020-03-23 MED ORDER — POVIDONE-IODINE 10 % EX SWAB
2.0000 "application " | Freq: Once | CUTANEOUS | Status: AC
  Administered 2020-03-23: 2 via TOPICAL

## 2020-03-23 MED ORDER — PROPOFOL 10 MG/ML IV BOLUS
INTRAVENOUS | Status: DC | PRN
  Administered 2020-03-23: 150 mg via INTRAVENOUS
  Administered 2020-03-23 (×2): 50 mg via INTRAVENOUS
  Administered 2020-03-23: 40 mg via INTRAVENOUS

## 2020-03-23 MED ORDER — KETAMINE HCL 10 MG/ML IJ SOLN
INTRAMUSCULAR | Status: DC | PRN
  Administered 2020-03-23: 10 mg via INTRAVENOUS
  Administered 2020-03-23 (×2): 20 mg via INTRAVENOUS

## 2020-03-23 MED ORDER — ORAL CARE MOUTH RINSE: 15.0000 mL | Freq: Once | OROMUCOSAL | Status: AC

## 2020-03-23 MED ORDER — METHOCARBAMOL 1000 MG/10ML IJ SOLN
500.0000 mg | Freq: Four times a day (QID) | INTRAVENOUS | Status: DC | PRN
  Filled 2020-03-23: qty 5

## 2020-03-23 MED ORDER — PROPOFOL 10 MG/ML IV BOLUS
INTRAVENOUS | Status: AC
  Filled 2020-03-23: qty 20

## 2020-03-23 MED ORDER — ROCURONIUM BROMIDE 10 MG/ML (PF) SYRINGE
PREFILLED_SYRINGE | INTRAVENOUS | Status: DC | PRN
  Administered 2020-03-23 (×2): 20 mg via INTRAVENOUS
  Administered 2020-03-23 (×2): 50 mg via INTRAVENOUS

## 2020-03-23 MED ORDER — OXYCODONE HCL 5 MG/5ML PO SOLN: 5.0000 mg | Freq: Once | ORAL | Status: AC | PRN

## 2020-03-23 MED ORDER — THIAMINE HCL 100 MG/ML IJ SOLN: 100.0000 mg | Freq: Every day | INTRAMUSCULAR | Status: DC

## 2020-03-23 MED ORDER — LORAZEPAM 2 MG/ML IJ SOLN: 0.0000 mg | Freq: Two times a day (BID) | INTRAMUSCULAR | Status: DC

## 2020-03-23 MED ORDER — PHENYLEPHRINE 40 MCG/ML (10ML) SYRINGE FOR IV PUSH (FOR BLOOD PRESSURE SUPPORT)
PREFILLED_SYRINGE | INTRAVENOUS | Status: DC | PRN
  Administered 2020-03-23 (×2): 80 ug via INTRAVENOUS
  Administered 2020-03-23: 40 ug via INTRAVENOUS

## 2020-03-23 MED ORDER — METHOCARBAMOL 500 MG PO TABS
500.0000 mg | ORAL_TABLET | Freq: Four times a day (QID) | ORAL | Status: DC | PRN
  Administered 2020-03-23 – 2020-03-24 (×2): 500 mg via ORAL
  Filled 2020-03-23 (×2): qty 1

## 2020-03-23 MED ORDER — BUPIVACAINE-EPINEPHRINE (PF) 0.5% -1:200000 IJ SOLN
INTRAMUSCULAR | Status: DC | PRN
Start: 2020-03-23 — End: 2020-03-23
  Administered 2020-03-23: 30 mL via PERINEURAL

## 2020-03-23 MED ORDER — MIDAZOLAM HCL 2 MG/2ML IJ SOLN
INTRAMUSCULAR | Status: DC | PRN
  Administered 2020-03-23: 2 mg via INTRAVENOUS

## 2020-03-23 MED ORDER — CEFAZOLIN SODIUM-DEXTROSE 2-4 GM/100ML-% IV SOLN
2.0000 g | INTRAVENOUS | Status: AC
  Administered 2020-03-23: 2 g via INTRAVENOUS
  Filled 2020-03-23: qty 100

## 2020-03-23 MED ORDER — ONDANSETRON HCL 4 MG PO TABS: 4.0000 mg | ORAL_TABLET | Freq: Four times a day (QID) | ORAL | Status: DC | PRN

## 2020-03-23 MED ORDER — POTASSIUM CHLORIDE IN NACL 20-0.9 MEQ/L-% IV SOLN
INTRAVENOUS | Status: DC
  Filled 2020-03-23: qty 1000

## 2020-03-23 MED ORDER — DOCUSATE SODIUM 100 MG PO CAPS
100.0000 mg | ORAL_CAPSULE | Freq: Two times a day (BID) | ORAL | Status: DC
  Administered 2020-03-23 – 2020-03-24 (×2): 100 mg via ORAL
  Filled 2020-03-23 (×2): qty 1

## 2020-03-23 MED ORDER — FENTANYL CITRATE (PF) 100 MCG/2ML IJ SOLN
INTRAMUSCULAR | Status: AC
  Administered 2020-03-23: 50 ug via INTRAVENOUS
  Filled 2020-03-23: qty 2

## 2020-03-23 MED ORDER — LIDOCAINE 2% (20 MG/ML) 5 ML SYRINGE
INTRAMUSCULAR | Status: DC | PRN
  Administered 2020-03-23: 50 mg via INTRAVENOUS

## 2020-03-23 MED ORDER — CEFAZOLIN SODIUM-DEXTROSE 2-4 GM/100ML-% IV SOLN
2.0000 g | Freq: Three times a day (TID) | INTRAVENOUS | Status: AC
  Administered 2020-03-23 – 2020-03-24 (×3): 2 g via INTRAVENOUS
  Filled 2020-03-23 (×3): qty 100

## 2020-03-23 MED ORDER — LORAZEPAM 1 MG PO TABS: 1.0000 mg | ORAL_TABLET | ORAL | Status: DC | PRN

## 2020-03-23 MED ORDER — LORAZEPAM 2 MG/ML IJ SOLN: 0.0000 mg | Freq: Four times a day (QID) | INTRAMUSCULAR | Status: DC

## 2020-03-23 MED ORDER — VANCOMYCIN HCL 1000 MG IV SOLR
INTRAVENOUS | Status: AC
  Filled 2020-03-23: qty 1000

## 2020-03-23 MED ORDER — ADULT MULTIVITAMIN W/MINERALS CH
1.0000 | ORAL_TABLET | Freq: Every day | ORAL | Status: DC
  Administered 2020-03-23 – 2020-03-24 (×2): 1 via ORAL
  Filled 2020-03-23 (×2): qty 1

## 2020-03-23 MED ORDER — OXYCODONE HCL 5 MG PO TABS
5.0000 mg | ORAL_TABLET | Freq: Once | ORAL | Status: AC | PRN
  Administered 2020-03-23: 5 mg via ORAL

## 2020-03-23 MED ORDER — THIAMINE HCL 100 MG PO TABS
100.0000 mg | ORAL_TABLET | Freq: Every day | ORAL | Status: DC
  Administered 2020-03-23 – 2020-03-24 (×2): 100 mg via ORAL
  Filled 2020-03-23 (×2): qty 1

## 2020-03-23 MED ORDER — ENOXAPARIN SODIUM 40 MG/0.4ML ~~LOC~~ SOLN
40.0000 mg | SUBCUTANEOUS | Status: DC
  Administered 2020-03-24: 40 mg via SUBCUTANEOUS
  Filled 2020-03-23 (×2): qty 0.4

## 2020-03-23 MED ORDER — OXYCODONE HCL 5 MG PO TABS
5.0000 mg | ORAL_TABLET | ORAL | Status: DC | PRN
  Administered 2020-03-23 – 2020-03-24 (×5): 10 mg via ORAL
  Filled 2020-03-23 (×5): qty 2

## 2020-03-23 MED ORDER — FENTANYL CITRATE (PF) 100 MCG/2ML IJ SOLN: 50.0000 ug | Freq: Once | INTRAMUSCULAR | Status: AC

## 2020-03-23 MED ORDER — VANCOMYCIN HCL 1000 MG IV SOLR
INTRAVENOUS | Status: DC | PRN
  Administered 2020-03-23: 1000 mg via TOPICAL

## 2020-03-23 MED ORDER — SUGAMMADEX SODIUM 200 MG/2ML IV SOLN
INTRAVENOUS | Status: DC | PRN
  Administered 2020-03-23: 200 mg via INTRAVENOUS

## 2020-03-23 MED ORDER — MIDAZOLAM HCL 2 MG/2ML IJ SOLN: 1.0000 mg | Freq: Once | INTRAMUSCULAR | Status: AC

## 2020-03-23 MED ORDER — METOCLOPRAMIDE HCL 5 MG/ML IJ SOLN: 5.0000 mg | Freq: Three times a day (TID) | INTRAMUSCULAR | Status: DC | PRN

## 2020-03-23 MED ORDER — HYDRALAZINE HCL 10 MG PO TABS: 10.0000 mg | ORAL_TABLET | Freq: Four times a day (QID) | ORAL | Status: DC | PRN

## 2020-03-23 MED ORDER — CHLORHEXIDINE GLUCONATE 0.12 % MT SOLN
15.0000 mL | Freq: Once | OROMUCOSAL | Status: AC
  Administered 2020-03-23: 15 mL via OROMUCOSAL
  Filled 2020-03-23: qty 15

## 2020-03-23 MED ORDER — LACTATED RINGERS IV SOLN: INTRAVENOUS | Status: DC

## 2020-03-23 MED ORDER — OXYCODONE HCL 5 MG PO TABS: 10.0000 mg | ORAL_TABLET | ORAL | Status: DC | PRN

## 2020-03-23 MED ORDER — DEXAMETHASONE SODIUM PHOSPHATE 10 MG/ML IJ SOLN
INTRAMUSCULAR | Status: DC | PRN
  Administered 2020-03-23: 8 mg via INTRAVENOUS

## 2020-03-23 MED ORDER — HYDROMORPHONE HCL 1 MG/ML IJ SOLN: 0.5000 mg | INTRAMUSCULAR | Status: DC | PRN

## 2020-03-23 MED ORDER — CHLORHEXIDINE GLUCONATE 4 % EX LIQD: 60.0000 mL | Freq: Once | CUTANEOUS | Status: DC

## 2020-03-23 MED ORDER — POLYETHYLENE GLYCOL 3350 17 G PO PACK
17.0000 g | PACK | Freq: Every day | ORAL | Status: DC | PRN
  Administered 2020-03-24: 17 g via ORAL

## 2020-03-23 MED ORDER — MIDAZOLAM HCL 2 MG/2ML IJ SOLN
INTRAMUSCULAR | Status: AC
  Administered 2020-03-23: 1 mg via INTRAVENOUS
  Filled 2020-03-23: qty 2

## 2020-03-23 MED ORDER — METOCLOPRAMIDE HCL 5 MG PO TABS: 5.0000 mg | ORAL_TABLET | Freq: Three times a day (TID) | ORAL | Status: DC | PRN

## 2020-03-23 MED ORDER — FOLIC ACID 1 MG PO TABS
1.0000 mg | ORAL_TABLET | Freq: Every day | ORAL | Status: DC
  Administered 2020-03-23 – 2020-03-24 (×2): 1 mg via ORAL
  Filled 2020-03-23 (×2): qty 1

## 2020-03-23 MED ORDER — OXYCODONE HCL 5 MG PO TABS
ORAL_TABLET | ORAL | Status: AC
  Filled 2020-03-23: qty 1

## 2020-03-23 SURGICAL SUPPLY — 72 items
APL PRP STRL LF DISP 70% ISPRP (MISCELLANEOUS) ×1
BIT DRILL 2.5X110 QC LCP DISP (BIT) ×2 IMPLANT
BIT DRILL CANN QC 4.3X180 (BIT) IMPLANT
BIT DRILL GUIDEWIRE 2.5X200 (WIRE) ×2 IMPLANT
BIT DRILL Q/COUPLING 1 (BIT) ×2 IMPLANT
BLADE CLIPPER SURG (BLADE) IMPLANT
BNDG CMPR MED 10X6 ELC LF (GAUZE/BANDAGES/DRESSINGS) ×1
BNDG COHESIVE 6X5 TAN STRL LF (GAUZE/BANDAGES/DRESSINGS) ×3 IMPLANT
BNDG ELASTIC 6X10 VLCR STRL LF (GAUZE/BANDAGES/DRESSINGS) ×3 IMPLANT
BRUSH SCRUB EZ PLAIN DRY (MISCELLANEOUS) ×6 IMPLANT
CANISTER SUCT 3000ML PPV (MISCELLANEOUS) ×3 IMPLANT
CHLORAPREP W/TINT 26 (MISCELLANEOUS) ×3 IMPLANT
COVER SURGICAL LIGHT HANDLE (MISCELLANEOUS) ×3 IMPLANT
COVER WAND RF STERILE (DRAPES) ×3 IMPLANT
DRAPE C-ARM 42X72 X-RAY (DRAPES) ×3 IMPLANT
DRAPE C-ARMOR (DRAPES) ×3 IMPLANT
DRAPE HALF SHEET 40X57 (DRAPES) ×6 IMPLANT
DRAPE ORTHO SPLIT 77X108 STRL (DRAPES) ×6
DRAPE SURG 17X23 STRL (DRAPES) ×3 IMPLANT
DRAPE SURG ORHT 6 SPLT 77X108 (DRAPES) ×2 IMPLANT
DRAPE U-SHAPE 47X51 STRL (DRAPES) ×3 IMPLANT
DRILL BIT 4.3MM (BIT) ×3
DRSG ADAPTIC 3X8 NADH LF (GAUZE/BANDAGES/DRESSINGS) IMPLANT
DRSG MEPILEX BORDER 4X12 (GAUZE/BANDAGES/DRESSINGS) IMPLANT
DRSG MEPILEX BORDER 4X4 (GAUZE/BANDAGES/DRESSINGS) IMPLANT
DRSG MEPILEX BORDER 4X8 (GAUZE/BANDAGES/DRESSINGS) ×2 IMPLANT
DRSG PAD ABDOMINAL 8X10 ST (GAUZE/BANDAGES/DRESSINGS) ×9 IMPLANT
ELECT REM PT RETURN 9FT ADLT (ELECTROSURGICAL) ×3
ELECTRODE REM PT RTRN 9FT ADLT (ELECTROSURGICAL) ×1 IMPLANT
GAUZE SPONGE 4X4 12PLY STRL (GAUZE/BANDAGES/DRESSINGS) ×3 IMPLANT
GLOVE BIO SURGEON STRL SZ 6.5 (GLOVE) ×6 IMPLANT
GLOVE BIO SURGEON STRL SZ7.5 (GLOVE) ×12 IMPLANT
GLOVE BIO SURGEONS STRL SZ 6.5 (GLOVE) ×3
GLOVE BIOGEL PI IND STRL 6.5 (GLOVE) ×1 IMPLANT
GLOVE BIOGEL PI IND STRL 7.5 (GLOVE) ×1 IMPLANT
GLOVE BIOGEL PI INDICATOR 6.5 (GLOVE) ×2
GLOVE BIOGEL PI INDICATOR 7.5 (GLOVE) ×2
GOWN STRL REUS W/ TWL LRG LVL3 (GOWN DISPOSABLE) ×3 IMPLANT
GOWN STRL REUS W/TWL LRG LVL3 (GOWN DISPOSABLE) ×9
KIT BASIN OR (CUSTOM PROCEDURE TRAY) ×3 IMPLANT
KIT TURNOVER KIT B (KITS) ×3 IMPLANT
NS IRRIG 1000ML POUR BTL (IV SOLUTION) ×3 IMPLANT
PACK TOTAL JOINT (CUSTOM PROCEDURE TRAY) ×3 IMPLANT
PAD ARMBOARD 7.5X6 YLW CONV (MISCELLANEOUS) ×6 IMPLANT
PAD CAST 4YDX4 CTTN HI CHSV (CAST SUPPLIES) ×1 IMPLANT
PADDING CAST ABS 6INX4YD NS (CAST SUPPLIES) ×2
PADDING CAST ABS COTTON 6X4 NS (CAST SUPPLIES) IMPLANT
PADDING CAST COTTON 4X4 STRL (CAST SUPPLIES) ×3
PADDING CAST COTTON 6X4 STRL (CAST SUPPLIES) ×3 IMPLANT
PLATE VA-LCP CONDYLAR 4.5 6H (Plate) ×2 IMPLANT
SCREW CORTEX ST 4.5X44 (Screw) ×2 IMPLANT
SCREW CORTEX ST 4.5X48 (Screw) ×2 IMPLANT
SCREW CORTEX ST 4.5X52 (Screw) ×2 IMPLANT
SCREW HEADED ST 3.5X70 (Screw) ×2 IMPLANT
SCREW HEADED ST 3.5X75 (Screw) ×2 IMPLANT
SCREW LOCKING VA 5.0X75MM (Screw) ×6 IMPLANT
SCREW VA LOCKING 5.0X50MM (Screw) ×2 IMPLANT
SCREW VA LOCKING 5.0X65 (Screw) ×2 IMPLANT
SPONGE LAP 18X18 RF (DISPOSABLE) IMPLANT
STAPLER VISISTAT 35W (STAPLE) ×3 IMPLANT
SUCTION FRAZIER HANDLE 10FR (MISCELLANEOUS) ×3
SUCTION TUBE FRAZIER 10FR DISP (MISCELLANEOUS) ×1 IMPLANT
SUT ETHILON 3 0 PS 1 (SUTURE) ×6 IMPLANT
SUT VIC AB 0 CT1 27 (SUTURE)
SUT VIC AB 0 CT1 27XBRD ANBCTR (SUTURE) IMPLANT
SUT VIC AB 1 CT1 27 (SUTURE)
SUT VIC AB 1 CT1 27XBRD ANBCTR (SUTURE) IMPLANT
SUT VIC AB 2-0 CT1 27 (SUTURE) ×6
SUT VIC AB 2-0 CT1 TAPERPNT 27 (SUTURE) ×2 IMPLANT
TOWEL GREEN STERILE (TOWEL DISPOSABLE) ×6 IMPLANT
TRAY FOLEY MTR SLVR 16FR STAT (SET/KITS/TRAYS/PACK) IMPLANT
WATER STERILE IRR 1000ML POUR (IV SOLUTION) ×6 IMPLANT

## 2020-03-23 NOTE — Progress Notes (Signed)
Dr. Chaney Malling notified about Hbg of 8.7 and K of 3.1.

## 2020-03-23 NOTE — Anesthesia Preprocedure Evaluation (Signed)
Anesthesia Evaluation  Patient identified by MRN, date of birth, ID band Patient awake    Reviewed: Allergy & Precautions, H&P , NPO status , Patient's Chart, lab work & pertinent test results  Airway Mallampati: II   Neck ROM: full    Dental   Pulmonary neg pulmonary ROS,    breath sounds clear to auscultation       Cardiovascular negative cardio ROS   Rhythm:regular Rate:Normal     Neuro/Psych  Headaches, PSYCHIATRIC DISORDERS Bipolar Disorder    GI/Hepatic   Endo/Other    Renal/GU      Musculoskeletal   Abdominal   Peds  Hematology  (+) anemia ,   Anesthesia Other Findings   Reproductive/Obstetrics                             Anesthesia Physical Anesthesia Plan  ASA: II  Anesthesia Plan: General   Post-op Pain Management:  Regional for Post-op pain   Induction: Intravenous  PONV Risk Score and Plan: 3 and Ondansetron, Dexamethasone, Midazolam and Treatment may vary due to age or medical condition  Airway Management Planned: LMA  Additional Equipment:   Intra-op Plan:   Post-operative Plan: Extubation in OR  Informed Consent: I have reviewed the patients History and Physical, chart, labs and discussed the procedure including the risks, benefits and alternatives for the proposed anesthesia with the patient or authorized representative who has indicated his/her understanding and acceptance.       Plan Discussed with: CRNA, Anesthesiologist and Surgeon  Anesthesia Plan Comments:         Anesthesia Quick Evaluation

## 2020-03-23 NOTE — Transfer of Care (Signed)
Immediate Anesthesia Transfer of Care Note  Patient: Sandra Warren  Procedure(s) Performed: OPEN REDUCTION INTERNAL FIXATION (ORIF) DISTAL FEMUR FRACTURE (Right )  Patient Location: PACU  Anesthesia Type:General  Level of Consciousness: patient cooperative and responds to stimulation  Airway & Oxygen Therapy: Patient Spontanous Breathing and Patient connected to nasal cannula oxygen  Post-op Assessment: Report given to RN, Post -op Vital signs reviewed and stable and Patient moving all extremities X 4  Post vital signs: Reviewed and stable  Last Vitals:  Vitals Value Taken Time  BP 151/87 03/23/20 1259  Temp    Pulse 100 03/23/20 1303  Resp 20 03/23/20 1303  SpO2 98 % 03/23/20 1303  Vitals shown include unvalidated device data.  Last Pain:  Vitals:   03/23/20 1040  TempSrc:   PainSc: 7       Patients Stated Pain Goal: 5 (03/23/20 1029)  Complications: No complications documented.

## 2020-03-23 NOTE — Discharge Instructions (Addendum)
[facility] Discharge Instructions   General Discharge Instructions  WEIGHT BEARING STATUS: Non-weightbearing right lower extremity  RANGE OF MOTION/ACTIVITY: Okay for gentle knee motion  Wound Care: You may remove your surgical dressing on post-op day #2 (Friday 03/25/20). You have steri-strips over your incisions, leave those in place and they will begin to fall off on their own.  Incisions can be left open to air if there is no drainage. If incision continues to have drainage, follow wound care instructions below. Okay to shower if no drainage from incisions.  DVT/PE prophylaxis: Aspirin 325 mg twice daily x 30 days  Diet: as you were eating previously.  Can use over the counter stool softeners and bowel preparations, such as Miralax, to help with bowel movements.  Narcotics can be constipating.  Be sure to drink plenty of fluids  PAIN MEDICATION USE AND EXPECTATIONS  You have likely been given narcotic medications to help control your pain.  After a traumatic event that results in an fracture (broken bone) with or without surgery, it is ok to use narcotic pain medications to help control one's pain.  We understand that everyone responds to pain differently and each individual patient will be evaluated on a regular basis for the continued need for narcotic medications. Ideally, narcotic medication use should last no more than 6-8 weeks (coinciding with fracture healing).   As a patient it is your responsibility as well to monitor narcotic medication use and report the amount and frequency you use these medications when you come to your office visit.   We would also advise that if you are using narcotic medications, you should take a dose prior to therapy to maximize you participation.  IF YOU ARE ON NARCOTIC MEDICATIONS IT IS NOT PERMISSIBLE TO OPERATE A MOTOR VEHICLE (MOTORCYCLE/CAR/TRUCK/MOPED) OR HEAVY MACHINERY DO NOT MIX NARCOTICS WITH OTHER CNS (CENTRAL NERVOUS SYSTEM)  DEPRESSANTS SUCH AS ALCOHOL   STOP SMOKING OR USING NICOTINE PRODUCTS!!!!  As discussed nicotine severely impairs your body's ability to heal surgical and traumatic wounds but also impairs bone healing.  Wounds and bone heal by forming microscopic blood vessels (angiogenesis) and nicotine is a vasoconstrictor (essentially, shrinks blood vessels).  Therefore, if vasoconstriction occurs to these microscopic blood vessels they essentially disappear and are unable to deliver necessary nutrients to the healing tissue.  This is one modifiable factor that you can do to dramatically increase your chances of healing your injury.    (This means no smoking, no nicotine gum, patches, etc)  DO NOT USE NONSTEROIDAL ANTI-INFLAMMATORY DRUGS (NSAID'S)  Using products such as Advil (ibuprofen), Aleve (naproxen), Motrin (ibuprofen) for additional pain control during fracture healing can delay and/or prevent the healing response.  If you would like to take over the counter (OTC) medication, Tylenol (acetaminophen) is ok.  However, some narcotic medications that are given for pain control contain acetaminophen as well. Therefore, you should not exceed more than 4000 mg of tylenol in a day if you do not have liver disease.  Also note that there are may OTC medicines, such as cold medicines and allergy medicines that my contain tylenol as well.  If you have any questions about medications and/or interactions please ask your doctor/PA or your pharmacist.      ICE AND ELEVATE INJURED/OPERATIVE EXTREMITY  Using ice and elevating the injured extremity above your heart can help with swelling and pain control.  Icing in a pulsatile fashion, such as 20 minutes on and 20 minutes off, can be  followed.    Do not place ice directly on skin. Make sure there is a barrier between to skin and the ice pack.    Using frozen items such as frozen peas works well as the conform nicely to the are that needs to be iced.  USE AN ACE WRAP OR TED  HOSE FOR SWELLING CONTROL  In addition to icing and elevation, Ace wraps or TED hose are used to help limit and resolve swelling.  It is recommended to use Ace wraps or TED hose until you are informed to stop.    When using Ace Wraps start the wrapping distally (farthest away from the body) and wrap proximally (closer to the body)   Example: If you had surgery on your leg or thing and you do not have a splint on, start the ace wrap at the toes and work your way up to the thigh        If you had surgery on your upper extremity and do not have a splint on, start the ace wrap at your fingers and work your way up to the upper arm    CALL THE OFFICE WITH ANY QUESTIONS OR CONCERNS: [phone]   VISIT OUR WEBSITE FOR ADDITIONAL INFORMATION: [email or web address]     Discharge Wound Care Instructions  Do NOT apply any ointments, solutions or lotions to pin sites or surgical wounds.  These prevent needed drainage and even though solutions like hydrogen peroxide kill bacteria, they also damage cells lining the pin sites that help fight infection.  Applying lotions or ointments can keep the wounds moist and can cause them to breakdown and open up as well. This can increase the risk for infection. When in doubt call the office.  Surgical incisions should be dressed daily.  If any drainage is noted, use one layer of adaptic, then gauze, Kerlix, and an ace wrap.  Once the incision is completely dry and without drainage, it may be left open to air out.  Showering may begin 36-48 hours later.  Cleaning gently with soap and water.  Traumatic wounds should be dressed daily as well.    One layer of adaptic, gauze, Kerlix, then ace wrap.  The adaptic can be discontinued once the draining has ceased    If you have a wet to dry dressing: wet the gauze with saline the squeeze as much saline out so the gauze is moist (not soaking wet), place moistened gauze over wound, then place a dry gauze over the moist one,  followed by Kerlix wrap, then ace wrap.    Distal Femur Fracture, Adult A distal femur fracture is a break in the lower part of the thigh bone (femur) near the knee joint. There are several types of this fracture. They include:  Stable. The bones remain in place after the break.  Displaced. The bones no longer line up after the break.  Comminuted. The bone breaks into several pieces.  Open. The broken bone comes through the skin. What are the causes? This condition may be caused by:  A fall. This injury can result from the impact of the fall or other violent contact.  A motor vehicle accident.  A sports injury.  Other collisions with a hard surface. What increases the risk? You are more likely to develop this condition if:  You are female.  You are 35-92 years old.  You participate in high-energy sports such as soccer, ice hockey, football, and baseball.  You have a condition that weakens the  bones, such as osteoporosis.  You have had a knee replacement. What are the signs or symptoms? Symptoms of this condition include:  Pain.  Swelling.  Bruising.  Inability to bend your knee.  Misshapen knee.  Inability to walk.  Inability to use your injured leg to support your body weight. How is this diagnosed? This condition may be diagnosed based on:  Your symptoms.  A physical exam.  Other tests, such as: ? Imaging studies, such as an X-ray, CT scan, MRI scan, or ultrasound. ? A procedure to view the inside of your knee with a small camera (arthroscopy). How is this treated? This condition may be treated with:  A splint. You will wear the splint until the swelling goes down.  A cast. This is used to keep the fractured bone from moving while it heals. A cast is usually put on after swelling has gone down.  Surgery. This is done to move the bones back into place. The surgeon will use a combination of screws, metal plates, or rods (hardware) to hold the bones  in place.  Physical therapy. Follow these instructions at home: Medicines  Take over-the-counter and prescription medicines only as told by your health care provider.  Do not drive or operate heavy machinery while taking pain medicine.  If you are taking prescription pain medicine, take actions to prevent or treat constipation. Your health care provider may recommend that you: ? Drink enough fluid to keep your urine pale yellow. ? Eat foods that are high in fiber, such as fresh fruits and vegetables, whole grains, and beans. ? Limit foods that are high in fat and processed sugars, such as fried or sweet foods. ? Take an over-the-counter or prescription medicine for constipation. If you have a splint:  Wear the splint as told by your health care provider. Remove it only as told by your health care provider.  Loosen the splint if your toes tingle, become numb, or turn cold and blue.  Keep the splint clean.  If the splint is not waterproof: ? Do not let it get wet. ? Cover it with a watertight covering when you take a bath or a shower. If you have a cast:  Do not stick anything inside the cast to scratch your skin. Doing that increases your risk of infection.  Check the skin around the cast every day. Tell your health care provider about any concerns.  You may put lotion on dry skin around the edges of the cast. Do not put lotion on the skin underneath the cast.  Keep the cast clean.  If the cast is not waterproof: ? Do not let it get wet. ? Cover it with a watertight covering when you take a bath or a shower. Activity  Do not use your leg to support your body weight until your health care provider says that you can. Follow weight-bearing restrictions.  Use crutches, a cane, or a walker as directed.  Return to your normal activities as directed by your health care provider. Ask your health care provider what activities are safe for you.  Do not drive until your health care  provider approves. Managing pain, stiffness, and swelling   If directed, put ice on the injured area: ? If you have a removable splint, remove it as told by your health care provider. ? Put ice in a plastic bag. ? Place a towel between your skin and the bag. ? Leave the ice on for 20 minutes, 2-3 times a  day.  Move your toes often to avoid stiffness and to lessen swelling.  Raise the injured area above the level of your heart while you are lying down. General instructions   Do not put pressure on any part of the cast or splint until it is fully hardened. This may take several hours.  Do not use any products that contain nicotine or tobacco, such as cigarettes and e-cigarettes. These can delay bone healing. If you need help quitting, ask your health care provider.  Keep all follow-up visits as told by your health care provider. This is important. Contact a health care provider if:  You have knee pain and swelling.  You have trouble walking.  Your cast becomes wet or damaged or suddenly feels too tight. Get help right away if:  Your pain and swelling get worse.  You have severe pain below the fracture.  Your skin or toenails turn blue or gray, feel cold, or become numb.  You have fluid, blood, or pus coming from under your cast.  You develop a fever.  You have pain, swelling, or redness in your leg. Summary  A distal femur fracture is a break in the lower part of the thigh bone (femur).  Falls are the most common causes of femur fractures, but sports-related injuries and motor vehicle accidents also can cause this.  Treatment will depend on characteristics of the fracture, such as location, shape, displacement, and whether the fracture broke through the skin. Options include splinting, casting, or surgery. This information is not intended to replace advice given to you by your health care provider. Make sure you discuss any questions you have with your health care  provider. Document Revised: 01/14/2019 Document Reviewed: 11/13/2017 Elsevier Patient Education  [place].

## 2020-03-23 NOTE — Interval H&P Note (Signed)
History and Physical Interval Note:  03/23/2020 10:36 AM  Sandra Warren  has presented today for surgery, with the diagnosis of Right distal femur fracture.  The various methods of treatment have been discussed with the patient and family. After consideration of risks, benefits and other options for treatment, the patient has consented to  Procedure(s): OPEN REDUCTION INTERNAL FIXATION (ORIF) DISTAL FEMUR FRACTURE (Right) as a surgical intervention.  The patient's history has been reviewed, patient examined, no change in status, stable for surgery.  I have reviewed the patient's chart and labs.  Questions were answered to the patient's satisfaction.     Caryn Bee P Seneca Gadbois

## 2020-03-23 NOTE — Anesthesia Procedure Notes (Signed)
Anesthesia Regional Block: Femoral nerve block   Pre-Anesthetic Checklist: ,, timeout performed, Correct Patient, Correct Site, Correct Laterality, Correct Procedure,, site marked, risks and benefits discussed, Surgical consent,  Pre-op evaluation,  At surgeon's request and post-op pain management  Laterality: Right  Prep: chloraprep       Needles:  Injection technique: Single-shot  Needle Type: Echogenic Stimulator Needle     Needle Length: 9cm  Needle Gauge: 21     Additional Needles:   Procedures:, nerve stimulator,,,, intact distal pulses,,,   Nerve Stimulator or Paresthesia:  Response: Quadriceps muscle contraction, 0.45 mA,   Additional Responses:   Narrative:  Start time: 03/23/2020 10:28 AM End time: 03/23/2020 10:32 AM Injection made incrementally with aspirations every 5 mL.  Performed by: Personally  Anesthesiologist: Achille Rich, MD  Additional Notes: Functioning IV was confirmed and monitors were applied.  A 38mm 21ga Arrow echogenic stimulator needle was used. Sterile prep and drape,hand hygiene and sterile gloves were used.  Negative aspiration and negative test dose prior to incremental administration of local anesthetic. The patient tolerated the procedure well.

## 2020-03-23 NOTE — Op Note (Signed)
Orthopaedic Surgery Operative Note (CSN: [id number] ) Date of Surgery: 03/23/2020  Admit Date: 03/23/2020   Diagnoses: Pre-Op Diagnoses: Closed displaced supracondylar fracture of distal end of right femur with intracondylar extension  Post-Op Diagnosis: Same  Procedures: CPT 27513-Open reduction internal fixation of right distal femur fracture  Surgeons : Primary: Roby Lofts, MD  Assistant: Ulyses Southward, PA-C  Location: OR 6   Anesthesia:General  Antibiotics: Ancef 2g preop with 1 gm vancomycin powder placed topically.   Tourniquet time:None    Estimated Blood Loss:100 mL  Complications:None   Specimens:None   Implants: Implant Name Type Inv. Item Serial No. Manufacturer Lot No. LRB No. Used Action  SCREW HEADED ST 3.5X70 - [id number] Screw SCREW HEADED ST 3.5X70  [employer]  Right 1 Implanted  SCREW HEADED ST 3.5X75 - [id number] Screw SCREW HEADED ST 3.5X75  [employer]  Right 1 Implanted  PLATE VA-LCP CONDYLAR 4.5 6H - [id number] Plate PLATE VA-LCP CONDYLAR 4.5 6H  [employer]  Right 1 Implanted  SCREW CORTEX 4.5 - [id number] Screw SCREW CORTEX 4.5  [employer]  Right 1 Implanted  SCREW VA LOCKING 5.0X65 - [id number] Screw SCREW VA LOCKING 5.0X65  [employer]  Right 1 Implanted  SCREW LOCKING VA 5.0X75MM - [id number] Screw SCREW LOCKING VA 5.0X75MM  [employer]  Right 3 Implanted  SCREW VA LOCKING 5.0X50MM - [id number] Screw SCREW VA LOCKING 5.0X50MM  [employer]  Right 1 Implanted  SCREW CORTEX 4.5 - [id number] Screw SCREW CORTEX 4.5  [employer]  Right 1 Implanted     Indications for Surgery: 64 year old female who sustained a right distal femur fracture with significant involvement of the lateral condyle.  Due to the unstable nature of her injury I recommended proceeding with open reduction internal fixation.  Risks and benefits were discussed with the patient.  Risks included but not limited to bleeding, infection, malunion, nonunion, hardware  failure, hardware irritation, knee stiffness, knee arthritis, nerve and blood vessel injury, even the possibility of anesthetic complications.  The patient agreed to proceed with surgery and consent was obtained.  Operative Findings: Open reduction internal fixation of right distal femur fracture using [employer] VA distal femoral locking plate with independent 3.5 millimeter screws for provisional fixation  Procedure: The patient was identified in the preoperative holding area. Consent was confirmed with the patient and their family and all questions were answered. The operative extremity was marked after confirmation with the patient. she was then brought back to the operating room by our anesthesia colleagues.  She was placed under general anesthetic and carefully transferred over to a radiolucent flat top table.  A bump was placed under her operative hip.  The right lower extremity was prepped and draped in usual sterile fashion.  A timeout was performed to verify the patient, the procedure, and the extremity.  Preoperative antibiotics were dosed.  Fluoroscopic imaging was obtained to show the unstable nature of her injury.  A lateral parapatellar incision was carried down through skin and subcutaneous tissue.  Identified the capsule and retinaculum and released this just lateral to the patellar tendon and patella.  I extended this proximally into a portion of the vastus lateralis.  I released the soft tissue along the lateral condyle to expose the fracture entirely.  The fracture had significant involvement of the lateral condyle with a significant valgus instability.  The lateral joint was displaced proximally and I proceeded to clean out the hematoma and callus that had formed.  The peripheral rim of the  articular cartilage was damaged so a full anatomic reduction was not able to be obtained however I was able to align the joint without any step-off with minimal gapping.  A reduction tenaculum was used  to reduce the articular surface.  And 3.5 millimeter screws from lateral to medial were used to hold provisional reduction of the joint.  I used fluoroscopy to confirm anatomic reduction of the condyle and then I proceeded to slide a 6-hole [employer] VA distal femoral locking plate along the lateral cortex and I held it proximally with a percutaneously placed instrument.  A K wire was used to hold the distal portion of plate flush to bone.  I then placed a nonlocking screw into the femoral shaft to buttress the lateral condyle and hold the plate in place.  I then directed via a 5.0 millimeter screws in the distal articular block to hold the reduction.  Percutaneous a 4.5 millimeter screws were placed into the femoral shaft to complete the construct.  Final fluoroscopic imaging was obtained.  The incision was copiously irrigated.  A gram of vancomycin powder was placed into the incision.  A layer closure of #1 Vicryl, 2-0 Vicryl and 3-0 Monocryl with Dermabond was used to close the skin.  Sterile dressing was placed consisting of Mepilex, sterile cast padding and an Ace wrap.  The patient was then awoken from anesthesia and taken to the PACU in stable condition.  Post Op Plan/Instructions: Patient will be nonweightbearing to the right lower extremity.  She will receive postoperative Ancef.  She will be placed on Lovenox for DVT prophylaxis while in the hospital and be discharged home on 325 mg twice daily.  We will have her mobilize with physical and Occupational Therapy.  I was present and performed the entire surgery.  Patrecia Pace, PA-C did assist me throughout the case. An assistant was necessary given the difficulty in approach, maintenance of reduction and ability to instrument the fracture.   Katha Hamming, MD [facility]

## 2020-03-24 DIAGNOSIS — S72461A Displaced supracondylar fracture with intracondylar extension of lower end of right femur, initial encounter for closed fracture: Secondary | ICD-10-CM | POA: Diagnosis not present

## 2020-03-24 LAB — BASIC METABOLIC PANEL
Anion gap: 11 (ref 5–15)
BUN: 5 mg/dL — ABNORMAL LOW (ref 8–23)
CO2: 26 mmol/L (ref 22–32)
Calcium: 8.5 mg/dL — ABNORMAL LOW (ref 8.9–10.3)
Chloride: 100 mmol/L (ref 98–111)
Creatinine, Ser: 0.65 mg/dL (ref 0.44–1.00)
GFR calc Af Amer: >60 mL/min (ref >60)
GFR calc non Af Amer: >60 mL/min (ref >60)
Glucose, Bld: 109 mg/dL — ABNORMAL HIGH (ref 70–99)
Potassium: 3.4 mmol/L — ABNORMAL LOW (ref 3.5–5.1)
Sodium: 137 mmol/L (ref 135–145)

## 2020-03-24 LAB — CBC
HCT: 23 % — ABNORMAL LOW (ref 36.0–46.0)
Hemoglobin: 7.7 g/dL — ABNORMAL LOW (ref 12.0–15.0)
MCH: 31.2 pg (ref 26.0–34.0)
MCHC: 33.5 g/dL (ref 30.0–36.0)
MCV: 93.1 fL (ref 80.0–100.0)
Platelets: 364 10*3/uL (ref 150–400)
RBC: 2.47 MIL/uL — ABNORMAL LOW (ref 3.87–5.11)
RDW: 13 % (ref 11.5–15.5)
WBC: 9.9 10*3/uL (ref 4.0–10.5)
nRBC: 0 % (ref 0.0–0.2)

## 2020-03-24 MED ORDER — METHOCARBAMOL 500 MG PO TABS: 500.0000 mg | ORAL_TABLET | Freq: Four times a day (QID) | ORAL | 0 refills | Status: DC | PRN

## 2020-03-24 MED ORDER — PERCOCET 5-325 MG PO TABS: 1.0000 | ORAL_TABLET | Freq: Four times a day (QID) | ORAL | 0 refills | Status: DC | PRN

## 2020-03-24 MED ORDER — VITAMIN D 50 MCG (2000 UT) PO CAPS: 2000.0000 [IU] | ORAL_CAPSULE | Freq: Every day | ORAL | 1 refills | Status: AC

## 2020-03-24 MED ORDER — ASPIRIN EC 325 MG PO TBEC
325.0000 mg | DELAYED_RELEASE_TABLET | Freq: Two times a day (BID) | ORAL | 0 refills | Status: AC
Start: 2020-03-24 — End: 2020-04-23

## 2020-03-24 NOTE — Anesthesia Postprocedure Evaluation (Signed)
Anesthesia Post Note  Patient: Sandra Warren  Procedure(s) Performed: OPEN REDUCTION INTERNAL FIXATION (ORIF) DISTAL FEMUR FRACTURE (Right )     Patient location during evaluation: PACU Anesthesia Type: General Level of consciousness: awake and alert Pain management: pain level controlled Vital Signs Assessment: post-procedure vital signs reviewed and stable Respiratory status: spontaneous breathing, nonlabored ventilation, respiratory function stable and patient connected to nasal cannula oxygen Cardiovascular status: blood pressure returned to baseline and stable Postop Assessment: no apparent nausea or vomiting Anesthetic complications: no   No complications documented.  Last Vitals:  Vitals:   03/24/20 0423 03/24/20 0729  BP: (!) 146/90 (!) 149/94  Pulse: 79 75  Resp: 18 15  Temp: 37.2 C 37.2 C  SpO2: 98% 100%    Last Pain:  Vitals:   03/24/20 0729  TempSrc: Oral  PainSc:                  Dawn Convery S

## 2020-03-24 NOTE — Progress Notes (Signed)
Patient discharged by private car.  Parents present at time of discharge.  All personal belongings gathered and taken with patient at time of discharge.  DME equipment, 3n1 and rolling walker were brought and taken with patient at time of discharge.  Discharge instructions were gone over and discussed, denied any questions.

## 2020-03-24 NOTE — Evaluation (Addendum)
Occupational Therapy Evaluation Patient Details Name: Sandra Warren MRN: [id number] DOB: 10/11/1955 Today's Date: 03/24/2020    History of Present Illness Pt is a 64 y.o. female admitted 03/23/20 after having a fall 1 wk prior (03/15/20) eventually seeking medical attention after no improvement in pain or ability to WB; imaging showed R displaced distal femur fx. S/p R distal femur ORIF 6/16. PMH includes bipolar disorder, migraine.   Clinical Impression   Prior to hospital admission, patient was living alone in a townhouse and was independent with all BADLs/IADLs. Patient was not working but enjoyed taking dance classes. Patient notes full flight of steps and R rail ascending to bedroom/full bathroom on 2nd level. Patient states that she is able to d/c home to her parents house. Home set-up includes single-level with 4 STE and bilateral rails. Patient currently presents below baseline level of functioning requiring Min A for bed mobility, Min guard for functional tranfers with use of standard walker, set-up A grossly for UB BADLs, and Min guard grossly for LB BADLs. Patient would benefit from continued acute OT services to maximize safety and independence with self-care tasks, functional transfers, and mobility. Patient would also benefit from follow-up home health OT in prep for safe return to PLOF.     Follow Up Recommendations  Home health OT;Supervision/Assistance - 24 hour    Equipment Recommendations  3 in 1 bedside commode;Tub/shower seat    Recommendations for Other Services       Precautions / Restrictions Restrictions Weight Bearing Restrictions: Yes RLE Weight Bearing: Non weight bearing      Mobility Bed Mobility Overal bed mobility: Needs Assistance Bed Mobility: Supine to Sit     Supine to sit: Min guard (At RLE)     General bed mobility comments: With HOB elevated and increased time 2/2 fear of pain.   Transfers Overall transfer level: Needs  assistance Equipment used: Standard walker Transfers: Sit to/from Stand;Stand Pivot Transfers Sit to Stand: Min guard;From elevated surface Stand pivot transfers: Min guard;From elevated surface       General transfer comment: Sit to stand from elevated EOB to RW and SPT to recliner with Min guard    Balance Overall balance assessment: Needs assistance Sitting-balance support: Feet supported (Single leg support. NWB RLE.) Sitting balance-Leahy Scale: Good     Standing balance support: Bilateral upper extremity supported Standing balance-Leahy Scale: Fair Standing balance comment: Heavy reliance on BUE.                           ADL either performed or assessed with clinical judgement   ADL Overall ADL's : Needs assistance/impaired     Grooming: Wash/dry hands;Wash/dry face;Set up;Sitting   Upper Body Bathing: Set up;Sitting Upper Body Bathing Details (indicate cue type and reason): Seated EOB patient able to bathe UB Lower Body Bathing: Min guard;Sit to/from stand Lower Body Bathing Details (indicate cue type and reason): Sit to stand from EOB with adherence to RLE NWB precautions Upper Body Dressing : Set up;Sitting   Lower Body Dressing: Minimal assistance   Toilet Transfer: Min guard             General ADL Comments: Patient able to bathe UB/LB sitting/standing at EOB with use of standard walker and set-up to Min A with good adherence to RLE NWB precautions.      Vision Baseline Vision/History: Wears glasses Wears Glasses: Reading only Patient Visual Report: No change from baseline Vision Assessment?: No apparent  visual deficits     Perception     Praxis      Pertinent Vitals/Pain Pain Assessment: 0-10 Pain Score: 8  Pain Location: R knee Pain Descriptors / Indicators: Aching;Constant Pain Intervention(s): Limited activity within patient's tolerance;Monitored during session     Hand Dominance Left   Extremity/Trunk Assessment Upper  Extremity Assessment Upper Extremity Assessment: Overall WFL for tasks assessed   Lower Extremity Assessment Lower Extremity Assessment: Defer to PT evaluation       Communication Communication Communication: No difficulties   Cognition Arousal/Alertness: Awake/alert Behavior During Therapy: WFL for tasks assessed/performed Overall Cognitive Status: Within Functional Limits for tasks assessed                                     General Comments       Exercises     Shoulder Instructions      Home Living Family/patient expects to be discharged to:: Private residence Living Arrangements: Alone Available Help at Discharge: Family;Available 24 hours/day Type of Home: Other(Comment) (Townhome) Home Access: Stairs to enter [employer] of Steps: 13 Entrance Stairs-Rails: Left Home Layout: Two level;1/2 bath on main level;Bed/bath upstairs     Bathroom Shower/Tub: Walk-in Pensions consultant: Standard Bathroom Accessibility: Yes How Accessible: Accessible via walker Home Equipment: Walker - standard          Prior Functioning/Environment Level of Independence: Independent        Comments: Independent with BADLs/IADLs. Wasn't working.         OT Problem List: Decreased strength;Impaired balance (sitting and/or standing);Decreased knowledge of use of DME or AE;Pain      OT Treatment/Interventions: Self-care/ADL training;Therapeutic exercise;Energy conservation;DME and/or AE instruction;Therapeutic activities;Patient/family education;Balance training    OT Goals(Current goals can be found in the care plan section) Acute Rehab OT Goals Patient Stated Goal: To return home.  OT Goal Formulation: With patient Time For Goal Achievement: 04/07/20 Potential to Achieve Goals: Good ADL Goals Pt Will Perform Lower Body Dressing: with modified independence;sit to/from stand;with adaptive equipment Pt Will Transfer to Toilet: with  modified independence;ambulating;bedside commode Pt Will Perform Toileting - Clothing Manipulation and hygiene: with modified independence;sitting/lateral leans Pt Will Perform Tub/Shower Transfer: Tub transfer;rolling walker;tub bench  OT Frequency: Min 2X/week   Barriers to D/C: Inaccessible home environment;Decreased caregiver support          Co-evaluation              AM-PAC OT "6 Clicks" Daily Activity     Outcome Measure Help from another person eating meals?: None Help from another person taking care of personal grooming?: None Help from another person toileting, which includes using toliet, bedpan, or urinal?: A Little Help from another person bathing (including washing, rinsing, drying)?: A Little Help from another person to put on and taking off regular upper body clothing?: A Little Help from another person to put on and taking off regular lower body clothing?: A Little 6 Click Score: 20   End of Session Equipment Utilized During Treatment: Gait belt (Standard walker) Nurse Communication: Mobility status  Activity Tolerance: Patient tolerated treatment well Patient left: in chair;with call bell/phone within reach;with chair alarm set  OT Visit Diagnosis: Unsteadiness on feet (R26.81);History of falling (Z91.81);Pain Pain - Right/Left: Right Pain - part of body: Leg;Knee                Time: 2130-8657 OT Time Calculation (  min): 40 min Charges:  OT General Charges $OT Visit: 1 Visit OT Evaluation $OT Eval Moderate Complexity: 1 Mod OT Treatments $Self Care/Home Management : 8-22 mins  Stephanne Greeley H. OTR/L Supplemental OT, Department of rehab services [phone]  Sandra Tavella R H. 03/24/2020, 1:45 PM

## 2020-03-24 NOTE — Plan of Care (Signed)
  Problem: Education: Goal: Knowledge of General Education information will improve Description: Including pain rating scale, medication(s)/side effects and non-pharmacologic comfort measures 03/24/2020 1653 by Lenise Arena, RN Outcome: Adequate for Discharge 03/24/2020 0809 by Lenise Arena, RN Outcome: Progressing   Problem: Health Behavior/Discharge Planning: Goal: Ability to manage health-related needs will improve 03/24/2020 1653 by Lenise Arena, RN Outcome: Adequate for Discharge 03/24/2020 0809 by Lenise Arena, RN Outcome: Progressing   Problem: Clinical Measurements: Goal: Ability to maintain clinical measurements within normal limits will improve 03/24/2020 1653 by Lenise Arena, RN Outcome: Adequate for Discharge 03/24/2020 0809 by Lenise Arena, RN Outcome: Progressing Goal: Will remain free from infection 03/24/2020 1653 by Lenise Arena, RN Outcome: Adequate for Discharge 03/24/2020 0809 by Lenise Arena, RN Outcome: Progressing Goal: Diagnostic test results will improve 03/24/2020 1653 by Lenise Arena, RN Outcome: Adequate for Discharge 03/24/2020 0809 by Lenise Arena, RN Outcome: Progressing Goal: Respiratory complications will improve 03/24/2020 1653 by Lenise Arena, RN Outcome: Adequate for Discharge 03/24/2020 0809 by Lenise Arena, RN Outcome: Progressing Goal: Cardiovascular complication will be avoided 03/24/2020 1653 by Lenise Arena, RN Outcome: Adequate for Discharge 03/24/2020 0809 by Lenise Arena, RN Outcome: Progressing   Problem: Activity: Goal: Risk for activity intolerance will decrease 03/24/2020 1653 by Lenise Arena, RN Outcome: Adequate for Discharge 03/24/2020 0809 by Lenise Arena, RN Outcome: Progressing   Problem: Nutrition: Goal: Adequate nutrition will be maintained 03/24/2020 1653 by Lenise Arena, RN Outcome: Adequate for Discharge 03/24/2020 0809 by Lenise Arena, RN Outcome: Progressing   Problem: Coping: Goal: Level of anxiety  will decrease 03/24/2020 1653 by Lenise Arena, RN Outcome: Adequate for Discharge 03/24/2020 0809 by Lenise Arena, RN Outcome: Progressing   Problem: Elimination: Goal: Will not experience complications related to bowel motility 03/24/2020 1653 by Lenise Arena, RN Outcome: Adequate for Discharge 03/24/2020 0809 by Lenise Arena, RN Outcome: Progressing Goal: Will not experience complications related to urinary retention 03/24/2020 1653 by Lenise Arena, RN Outcome: Adequate for Discharge 03/24/2020 0809 by Lenise Arena, RN Outcome: Progressing   Problem: Pain Managment: Goal: General experience of comfort will improve 03/24/2020 1653 by Lenise Arena, RN Outcome: Adequate for Discharge 03/24/2020 0809 by Lenise Arena, RN Outcome: Progressing   Problem: Safety: Goal: Ability to remain free from injury will improve 03/24/2020 1653 by Lenise Arena, RN Outcome: Adequate for Discharge 03/24/2020 0809 by Lenise Arena, RN Outcome: Progressing   Problem: Skin Integrity: Goal: Risk for impaired skin integrity will decrease 03/24/2020 1653 by Lenise Arena, RN Outcome: Adequate for Discharge 03/24/2020 0809 by Lenise Arena, RN Outcome: Progressing   Problem: Acute Rehab OT Goals (only OT should resolve) Goal: Pt. Will Perform Lower Body Dressing Outcome: Adequate for Discharge Goal: Pt. Will Transfer To Toilet Outcome: Adequate for Discharge Goal: Pt. Will Perform Toileting-Clothing Manipulation Outcome: Adequate for Discharge Goal: Pt. Will Perform Tub/Shower Transfer Outcome: Adequate for Discharge

## 2020-03-24 NOTE — TOC Initial Note (Signed)
Transition of Care [facility]) - Initial/Assessment Note    Patient Details  Name: Sandra Warren MRN: [id number] Date of Birth: [birth year]  [facility] CM/SW Contact:    Curlene Labrum, RN Phone Number: 03/24/2020, 2:10 PM  Clinical Narrative:                 Case management met with the patient and family and the patient normally lives alone - but she will be recovering at her parents house at [address], [place], [place] [address].  The patient was given medicare choice regarding dme and home health and the patient did not have a preference.  [employer] was called and patient was ordered a 3:1 and RW.  Alvis Lemmings was called and patient was set up for PT/OT.  Patient is aware and it is noted in the discharge instructions.  Expected Discharge Plan: [place]     Patient Goals and CMS Choice Patient states their goals for this hospitalization and ongoing recovery are:: Patient is ready to feel better and go home today. CMS Medicare.gov Compare Post Acute Care list provided to:: Patient Choice offered to / list presented to : Patient  Expected Discharge Plan and Services Expected Discharge Plan: [place] In-house Referral: Clinical Social Work Discharge Planning Services: CM Consult Post Acute Care Choice: Durable Medical Equipment, Home Health Living arrangements for the past 2 months: [place] (Patient is recovering at her parents house - [address] [place], [place] [address])                 DME Arranged: 3-N-1, Walker rolling DME Agency: [facility] Date DME Agency Contacted: 03/24/20 Time DME Agency Contacted: [phone] Representative spoke with at DME Agency: Thedore Mins HH Arranged: PT, OT [place] Agency: [place] Date [place]: 03/24/20 Time [place]: 1405 Representative spoke with at [place] Arrangements/Services Living arrangements for the past 2 months: [place] (Patient is recovering at her parents house - [address], [place], [place] [address]) Lives with:: Self Patient language and need for interpreter reviewed:: Yes Do you feel safe going back to the place where you live?: Yes      Need for Family Participation in Patient Care: Yes (Comment) Care giver support system in place?: Yes (comment)   Criminal Activity/Legal Involvement Pertinent to Current Situation/Hospitalization: No - Comment as needed  Activities of Daily Living Home Assistive Devices/Equipment: Eyeglasses, Dentures (specify type), Walker (specify type) ADL Screening (condition at time of admission) Patient's cognitive ability adequate to safely complete daily activities?: Yes Is the patient deaf or have difficulty hearing?: No Does the patient have difficulty seeing, even when wearing glasses/contacts?: No Does the patient have difficulty concentrating, remembering, or making decisions?: No Patient able to express need for assistance with ADLs?: Yes Does the patient have difficulty dressing or bathing?: Yes Independently performs ADLs?: No Communication: Independent Dressing (OT): Independent Grooming: Independent Feeding: Independent Bathing: Needs assistance Is this a change from baseline?: Pre-admission baseline Toileting: Independent with device (comment) (WALKER) In/Out Bed: Independent with device (comment) Walks in Home: Independent with device (comment) Does the patient have difficulty walking or climbing stairs?: Yes Weakness of Legs: Right Weakness of Arms/Hands: None  Permission Sought/Granted Permission sought to share information with : Case Manager Permission granted to share information with : Yes, Verbal Permission Granted     Permission granted to share info w AGENCY: [facility] and [facility]  Emotional Assessment Appearance:: Appears stated age Attitude/Demeanor/Rapport: Engaged Affect (typically observed): Accepting Orientation:  : Oriented to Self, Oriented to Place, Oriented to  Time, Oriented to Situation Alcohol / Substance Use: Not Applicable Psych Involvement: No (comment)  Admission diagnosis:  Closed fracture of right distal femur [facility]) [S72.401A] Patient Active Problem List   Diagnosis Date Noted  . Closed fracture of right distal femur ([place]) 03/23/2020  . Closed displaced supracondylar fracture with intracondylar extension of lower end of right femur ([place]) 03/22/2020   PCP:  Lorene Dy, MD Pharmacy:   [facility] [place], [place] [address] Phone: [phone] Fax: [phone]     Social Determinants of Health (SDOH) Interventions    Readmission Risk Interventions Readmission Risk Prevention Plan 03/24/2020  Post Dischage Appt Complete  Medication Screening Complete  Transportation Screening Complete  Some recent data might be hidden

## 2020-03-24 NOTE — Plan of Care (Signed)

## 2020-03-24 NOTE — Discharge Summary (Signed)
Orthopaedic Trauma Service (OTS) Discharge Summary   Patient ID: Sandra Warren MRN: [id number] DOB/AGE: 65-Sep-1957 65 y.o.  Admit date: 03/23/2020 Discharge date: 03/24/2020  Admission Diagnoses: right distal femur fracture with intra-articular extension   Discharge Diagnoses:  Principal Problem:   Closed displaced supracondylar fracture with intracondylar extension of lower end of right femur [facility]) Active Problems:   Closed fracture of right distal femur [facility])   Past Medical History:  Diagnosis Date  . Bipolar disorder (HCC)   . Femur fracture, right (HCC)    distal  . Migraine   . Wears glasses      Procedures Performed: ORIF right distal femur  Discharged Condition: good  Hospital Course: Patient presented to [facility] on 03/23/2020 for scheduled surgical procedure.  Was taken to the operating room by Dr. Jena Gauss and underwent the above procedure.  She tolerated this well without complications.  Was instructed to be nonweightbearing on the right lower extremity postoperatively.  Began working with physical and occupational therapy starting on postoperative day #1.  Was started on Lovenox for DVT prophylaxis starting on postoperative day #1. On 03/24/2020, the patient was tolerating diet, working well with therapies, pain well controlled, vital signs stable, dressings clean, dry, intact and felt stable for discharge to home. Patient will follow up as below and knows to call with questions or concerns.     Consults: None  Significant Diagnostic Studies:   Results for orders placed or performed during the hospital encounter of 03/23/20 (from the past 168 hour(s))  Basic metabolic panel   Collection Time: 03/23/20  7:47 AM  Result Value Ref Range   Sodium 141 135 - 145 mmol/L   Potassium 3.1 (L) 3.5 - 5.1 mmol/L   Chloride 103 98 - 111 mmol/L   CO2 27 22 - 32 mmol/L   Glucose, Bld 98 70 - 99 mg/dL   BUN 6 (L) 8 - 23 mg/dL   Creatinine, Ser 7.41 0.44 - 1.00  mg/dL   Calcium 9.0 8.9 - 28.7 mg/dL   GFR calc non Af Amer >60 >60 mL/min   GFR calc Af Amer >60 >60 mL/min   Anion gap 11 5 - 15  CBC   Collection Time: 03/23/20  7:47 AM  Result Value Ref Range   WBC 7.3 4.0 - 10.5 K/uL   RBC 2.75 (L) 3.87 - 5.11 MIL/uL   Hemoglobin 8.7 (L) 12.0 - 15.0 g/dL   HCT 86.7 (L) 36 - 46 %   MCV 94.5 80.0 - 100.0 fL   MCH 31.6 26.0 - 34.0 pg   MCHC 33.5 30.0 - 36.0 g/dL   RDW 67.2 09.4 - 70.9 %   Platelets 370 150 - 400 K/uL   nRBC 0.0 0.0 - 0.2 %  Phosphorus   Collection Time: 03/23/20  7:47 AM  Result Value Ref Range   Phosphorus 3.1 2.5 - 4.6 mg/dL  Magnesium   Collection Time: 03/23/20  7:47 AM  Result Value Ref Range   Magnesium 1.9 1.7 - 2.4 mg/dL  Surgical pcr screen   Collection Time: 03/23/20  7:48 AM   Specimen: Nasal Mucosa; Nasal Swab  Result Value Ref Range   MRSA, PCR NEGATIVE NEGATIVE   Staphylococcus aureus NEGATIVE NEGATIVE  SARS Coronavirus 2 by RT PCR (hospital order, performed in [facility] hospital lab) Nasopharyngeal Nasopharyngeal Swab   Collection Time: 03/23/20  8:11 AM   Specimen: Nasopharyngeal Swab  Result Value Ref Range   SARS Coronavirus 2 NEGATIVE NEGATIVE  VITAMIN D 25 Hydroxy (Vit-D Deficiency, Fractures)   Collection Time: 03/23/20  4:30 PM  Result Value Ref Range   Vit D, 25-Hydroxy 13.54 (L) 30 - 100 ng/mL  Basic metabolic panel   Collection Time: 03/24/20  4:54 AM  Result Value Ref Range   Sodium 137 135 - 145 mmol/L   Potassium 3.4 (L) 3.5 - 5.1 mmol/L   Chloride 100 98 - 111 mmol/L   CO2 26 22 - 32 mmol/L   Glucose, Bld 109 (H) 70 - 99 mg/dL   BUN 5 (L) 8 - 23 mg/dL   Creatinine, Ser 0.65 0.44 - 1.00 mg/dL   Calcium 8.5 (L) 8.9 - 10.3 mg/dL   GFR calc non Af Amer >60 >60 mL/min   GFR calc Af Amer >60 >60 mL/min   Anion gap 11 5 - 15  CBC   Collection Time: 03/24/20  4:54 AM  Result Value Ref Range   WBC 9.9 4.0 - 10.5 K/uL   RBC 2.47 (L) 3.87 - 5.11 MIL/uL   Hemoglobin 7.7 (L) 12.0 - 15.0  g/dL   HCT 23.0 (L) 36 - 46 %   MCV 93.1 80.0 - 100.0 fL   MCH 31.2 26.0 - 34.0 pg   MCHC 33.5 30.0 - 36.0 g/dL   RDW 13.0 11.5 - 15.5 %   Platelets 364 150 - 400 K/uL   nRBC 0.0 0.0 - 0.2 %     Treatments: IV hydration, antibiotics: Ancef, analgesia: acetaminophen, Dilaudid and oxycodone, therapies: PT/OT and surgery:  ORIF right distal femur   Discharge Exam: General: Laying in bed, no acute distress.  Pleasant and cooperative Respiratory: No increased work of breathing at rest Right lower extremity: Dressing is clean, dry, intact.  Moderate knee effusion tenderness with palpation about the knee as expected.  Holds knee in about 15 degrees of flexion. Unable to fully extend knee secondary to pain and swelling.  Ankle dorsiflexion plantarflexion is intact.  Extremity warm. +DP pulse  Disposition: Discharge disposition: 06-Home-Health Care Svc        Allergies as of 03/24/2020      Reactions   Cheese    Peanut-containing Drug Products    Makes pt feel weird       Medication List    TAKE these medications   methocarbamol 500 MG tablet Commonly known as: ROBAXIN Take 1 tablet (500 mg total) by mouth every 6 (six) hours as needed for muscle spasms.   multivitamin with minerals tablet Take 1 tablet by mouth daily.   Percocet 5-325 MG tablet Generic drug: oxyCODONE-acetaminophen Take 1 tablet by mouth every 6 (six) hours as needed for severe pain.   Vitamin D 50 MCG (2000 UT) Caps Take 1 capsule (2,000 Units total) by mouth daily.            Durable Medical Equipment  (From admission, onward)         Start     Ordered   03/24/20 1358  For home use only DME Walker rolling  Once       Question Answer Comment  Walker: With 5 Inch Wheels   Patient needs a walker to treat with the following condition Femur fracture, right ([place])      03/24/20 1357   03/24/20 1357  For home use only DME 3 n 1  Once        03/24/20 1357   03/24/20 1342  For home use only DME 3 n  1  Once  03/24/20 1343   03/24/20 1342  For home use only DME Tub bench  Once        03/24/20 1343   03/24/20 1342  For home use only DME Shower stool  Once        03/24/20 1343          Follow-up Information    Haddix, Gillie Manners, MD. Schedule an appointment as soon as possible for a visit in 2 week(s).   Specialty: Orthopedic Surgery Why: for repeat x-rays Contact information: [address] [place] [address] [phone]        [facility] Follow up.   Why: [employer] will be providing you a rolling walker and 3:1 before you are discharged home. Contact information: [address] [address] [phone]        [facility], [facility] Follow up.   Specialty: Home Health Services Why: Frances [facility]urb[facility] will be providing you with home health physical therapy and occupational therapy within 24-48 hours after your discharge home. Contact information: [address] [place] [address] [phone]               Discharge Instructions and Plan: Patient will be discharged to home. Will be discharged on Aspirin 325 mg BID x 30 days for DVT prophylaxis. Patient has been provided with all the necessary DME for discharge. Patient will follow up with Dr. Jena Gauss in 2 weeks for repeat x-rays and wound check.   Signed:  Shawn Route. Ladonna Snide ?[phone]? (phone) 03/24/2020, 2:42 PM  [facility] [address] [place] [address] [phone] (F)

## 2020-03-24 NOTE — Evaluation (Signed)
Physical Therapy Evaluation Patient Details Name: Sandra Warren MRN: [id number] DOB: August 29, 1956 Today's Date: 03/24/2020   History of Present Illness  Pt is a 64 y.o. female admitted 03/23/20 after having a fall 1 wk prior (03/15/20) eventually seeking medical attention after no improvement in pain or ability to WB; imaging showed R displaced distal femur fx. S/p R distal femur ORIF 6/16. PMH includes bipolar disorder, migraine.    Clinical Impression  Pt presents with an overall decrease in functional mobility secondary to above. PTA, pt independent and lives alone; since fall 6/8, has been using standard walker to mobilize with assist from parents. Educ on precautions, positioning, therex (HEP provided), and importance of mobility. Today, pt able to perform transfer and gait training with RW at supervision-level; attempted stair training, but pt declined further attempts due to pain; verbal education and demonstration for correct technique. Pt preparing to d/c home today. If to remain admitted, will continue to follow acutely.     Follow Up Recommendations Home health PT;Supervision - Intermittent    Equipment Recommendations  3in1 (PT)    Recommendations for Other Services       Precautions / Restrictions Precautions Precautions: Fall Restrictions Weight Bearing Restrictions: Yes RLE Weight Bearing: Non weight bearing      Mobility  Bed Mobility Overal bed mobility: Needs Assistance Bed Mobility: Supine to Sit     Supine to sit: Min guard (At RLE)     General bed mobility comments: Received sitting in recliner  Transfers Overall transfer level: Needs assistance Equipment used: Rolling walker (2 wheeled) Transfers: Sit to/from Stand Sit to Stand: Supervision Stand pivot transfers: Min guard;From elevated surface       General transfer comment: Cues for hand placement standing from recliner to RW; supervision for safety; good ability to maintain RLE NWB precautions  once cued  Ambulation/Gait Ambulation/Gait assistance: Supervision Gait Distance (Feet): 8 Feet Assistive device: Rolling walker (2 wheeled)   Gait velocity: Decreased   General Gait Details: Trialled gait training with RW, pt able to hop on LLE with RW at supervision-level, good ability t o maintain RLE NWB precautions. Pt reports she will stick with her standard walker  Stairs Stairs: Yes       General stair comments: Attempted standing with rail support in stairwell 2x, but pt with too much RLE pain and declining additional attempt for stair training. Demonstrated correct technique hopping on LLE with BUE support on single rail. Pt reports feeling confident on how to ascend steps into parents' home as she has been doing this the past week since initial fx  Wheelchair Mobility    Modified Rankin (Stroke Patients Only)       Balance Overall balance assessment: Needs assistance Sitting-balance support: Feet supported (Single leg support. NWB RLE.) Sitting balance-Leahy Scale: Good     Standing balance support: Bilateral upper extremity supported Standing balance-Leahy Scale: Poor Standing balance comment: Reliant on BUE support                             Pertinent Vitals/Pain Pain Assessment: Faces Pain Score: 8  Faces Pain Scale: Hurts even more Pain Location: R knee into thigh Pain Descriptors / Indicators: Constant;Grimacing;Guarding;Crying Pain Intervention(s): Monitored during session;Limited activity within patient's tolerance    Home Living Family/patient expects to be discharged to:: Private residence Living Arrangements: Alone Available Help at Discharge: Family;Available 24 hours/day Type of Home: House Home Access: Stairs to enter Entrance Stairs-Rails:  Right;Left Entrance Stairs-Number of Steps: 4 Home Layout: One level Home Equipment: Walker - standard Additional Comments: Plans to stay at parents' home who can provide 24/7 assist; home  set-up provided is for their home    Prior Function Level of Independence: Independent         Comments: Prior to fall, independent, wasn't working. Since fall on 6/8, has been ambulatory with standard walker, bumping up steps on buttocks     Hand Dominance   Dominant Hand: Left    Extremity/Trunk Assessment   Upper Extremity Assessment Upper Extremity Assessment: Overall WFL for tasks assessed    Lower Extremity Assessment Lower Extremity Assessment: RLE deficits/detail RLE Deficits / Details: s/p R femur ORIF; knee flex and ext at least 3/5, although limited by pain and ace wrapping; hip flex <3/5 limited by post-op pain/weakness       Communication   Communication: No difficulties  Cognition Arousal/Alertness: Awake/alert Behavior During Therapy: WFL for tasks assessed/performed Overall Cognitive Status: Within Functional Limits for tasks assessed                                        General Comments General comments (skin integrity, edema, etc.): Parents present during session and supportive. Discussed DME needs - pt would like BSC to use as tub chair, but does not need RW (will stick with standard walker she already owns)    Exercises Other Exercises Other Exercises: HEP (Access Code [id number]) handout provided - LAQ, gastroc stretch with strep, supine hip abd/add, SLR   Assessment/Plan    PT Assessment Patient needs continued PT services  PT Problem List Decreased strength;Decreased range of motion;Decreased activity tolerance;Decreased balance;Decreased mobility;Decreased knowledge of use of DME;Decreased knowledge of precautions;Pain       PT Treatment Interventions Therapeutic exercise;DME instruction;Gait training;Balance training;Stair training;Functional mobility training;Therapeutic activities;Patient/family education    PT Goals (Current goals can be found in the Care Plan section)  Acute Rehab PT Goals Patient Stated Goal: Home  today PT Goal Formulation: With patient Time For Goal Achievement: 04/07/20 Potential to Achieve Goals: Good    Frequency Min 5X/week   Barriers to discharge        Co-evaluation               AM-PAC PT "6 Clicks" Mobility  Outcome Measure Help needed turning from your back to your side while in a flat bed without using bedrails?: None Help needed moving from lying on your back to sitting on the side of a flat bed without using bedrails?: None Help needed moving to and from a bed to a chair (including a wheelchair)?: None Help needed standing up from a chair using your arms (e.g., wheelchair or bedside chair)?: None Help needed to walk in hospital room?: A Little Help needed climbing 3-5 steps with a railing? : A Little 6 Click Score: 22    End of Session   Activity Tolerance: Patient limited by pain Patient left: in chair;with call bell/phone within reach;with nursing/sitter in room;with chair alarm set;with family/visitor present Nurse Communication: Mobility status PT Visit Diagnosis: Other abnormalities of gait and mobility (R26.89);Pain Pain - Right/Left: Right Pain - part of body: Leg;Knee    Time: 1638-4536 PT Time Calculation (min) (ACUTE ONLY): 24 min   Charges:   PT Evaluation $PT Eval Low Complexity: 1 Low PT Treatments $Gait Training: 8-22 mins  Mabeline Caras, PT, DPT Acute Rehabilitation Services  Pager [phone] Office [place] 03/24/2020, 2:26 PM

## 2020-03-30 ENCOUNTER — Encounter (HOSPITAL_COMMUNITY): Payer: Self-pay | Admitting: Student

## 2020-03-31 ENCOUNTER — Encounter (HOSPITAL_COMMUNITY): Payer: Self-pay | Admitting: Student

## 2020-04-01 ENCOUNTER — Encounter (HOSPITAL_COMMUNITY): Payer: Self-pay | Admitting: Student

## 2020-04-04 ENCOUNTER — Encounter (HOSPITAL_COMMUNITY): Payer: Self-pay | Admitting: Student

## 2020-09-22 ENCOUNTER — Ambulatory Visit: Payer: Medicare Other | Admitting: Physical Therapy

## 2020-10-03 ENCOUNTER — Ambulatory Visit: Payer: Medicare Other | Admitting: Physical Therapy

## 2020-10-04 ENCOUNTER — Ambulatory Visit: Payer: Medicare Other | Admitting: Physical Therapy

## 2020-11-16 ENCOUNTER — Ambulatory Visit: Payer: Medicare Other

## 2020-12-10 ENCOUNTER — Other Ambulatory Visit: Payer: Self-pay

## 2020-12-10 ENCOUNTER — Encounter (HOSPITAL_COMMUNITY): Payer: Self-pay

## 2020-12-10 ENCOUNTER — Emergency Department (HOSPITAL_COMMUNITY): Payer: Medicare Other

## 2020-12-10 ENCOUNTER — Emergency Department (HOSPITAL_COMMUNITY)
Admission: EM | Admit: 2020-12-10 | Discharge: 2020-12-10 | Disposition: A | Discharge status: Home / Self Care | Payer: Medicare Other | Attending: Emergency Medicine | Admitting: Emergency Medicine

## 2020-12-10 DIAGNOSIS — R4182 Altered mental status, unspecified: Secondary | ICD-10-CM | POA: Diagnosis not present

## 2020-12-10 DIAGNOSIS — Z91010 Allergy to peanuts: Secondary | ICD-10-CM | POA: Diagnosis not present

## 2020-12-10 LAB — CBC
HCT: 32.5 % — ABNORMAL LOW (ref 36.0–46.0)
Hemoglobin: 11 g/dL — ABNORMAL LOW (ref 12.0–15.0)
MCH: 31.3 pg (ref 26.0–34.0)
MCHC: 33.8 g/dL (ref 30.0–36.0)
MCV: 92.6 fL (ref 80.0–100.0)
Platelets: 246 10*3/uL (ref 150–400)
RBC: 3.51 MIL/uL — ABNORMAL LOW (ref 3.87–5.11)
RDW: 13 % (ref 11.5–15.5)
WBC: 4.5 10*3/uL (ref 4.0–10.5)
nRBC: 0 % (ref 0.0–0.2)

## 2020-12-10 LAB — BASIC METABOLIC PANEL
Anion gap: 10 (ref 5–15)
BUN: 7 mg/dL — ABNORMAL LOW (ref 8–23)
CO2: 22 mmol/L (ref 22–32)
Calcium: 8.9 mg/dL (ref 8.9–10.3)
Chloride: 106 mmol/L (ref 98–111)
Creatinine, Ser: 0.66 mg/dL (ref 0.44–1.00)
GFR, Estimated: >60 mL/min (ref >60)
Glucose, Bld: 93 mg/dL (ref 70–99)
Potassium: 3.2 mmol/L — ABNORMAL LOW (ref 3.5–5.1)
Sodium: 138 mmol/L (ref 135–145)

## 2020-12-10 LAB — URINALYSIS, ROUTINE W REFLEX MICROSCOPIC
Bacteria, UA: NONE SEEN
Bilirubin Urine: NEGATIVE
Glucose, UA: NEGATIVE mg/dL
Ketones, ur: NEGATIVE mg/dL
Leukocytes,Ua: NEGATIVE
Nitrite: NEGATIVE
Protein, ur: 30 mg/dL — AB
Specific Gravity, Urine: 1.016 (ref 1.005–1.030)
pH: 5 (ref 5.0–8.0)

## 2020-12-10 LAB — RAPID URINE DRUG SCREEN, HOSP PERFORMED
Amphetamines: NOT DETECTED
Barbiturates: POSITIVE — AB
Benzodiazepines: NOT DETECTED
Cocaine: NOT DETECTED
Opiates: NOT DETECTED
Tetrahydrocannabinol: POSITIVE — AB

## 2020-12-10 LAB — CBG MONITORING, ED: Glucose-Capillary: 111 mg/dL — ABNORMAL HIGH (ref 70–99)

## 2020-12-10 LAB — ETHANOL: Alcohol, Ethyl (B): <10 mg/dL (ref <10)

## 2020-12-10 NOTE — ED Notes (Signed)
When this tech checked on patient, pt had put her clothes back on and had removed her IV. She stated that she wanted to go home. Will inform RN.

## 2020-12-10 NOTE — ED Notes (Signed)
Patient is resting comfortably.  A&o, warm blanket given, call bell placed within reach.

## 2020-12-10 NOTE — ED Provider Notes (Signed)
[facility]-EMERGENCY DEPT Provider Note   CSN: [id number] Arrival date & time: 12/10/20  1702     History No chief complaint on file.   Sandra Warren is a 65 y.o. female.  Patient presenting to the emergency department today brought in by EMS after she drove to local [employer] and was noted to be altered and out of balance and local police officer.  Local please officer, per the patient, stated that if the patient drove home he would give her a ticket for driving under the influence and recommended that she go to the hospital for evaluation.  In the ED patient is A&O x3 but is slurring her words.  Patient denies any substance use, abuse.  Denies any illicit drug use.  States she has not had alcohol to consume in over 14 years.  Vitals are stable.  No focal neurological deficits.       Past Medical History:  Diagnosis Date   Bipolar disorder (HCC)    Femur fracture, right (HCC)    distal   Migraine    Wears glasses     Patient Active Problem List   Diagnosis Date Noted   Altered mental status 12/10/2020   Closed fracture of right distal femur (HCC) 03/23/2020   Closed displaced supracondylar fracture with intracondylar extension of lower end of right femur (HCC) 03/22/2020    Past Surgical History:  Procedure Laterality Date   ABDOMINAL HYSTERECTOMY     COLONOSCOPY     ORIF FEMUR FRACTURE Right 03/23/2020   Procedure: OPEN REDUCTION INTERNAL FIXATION (ORIF) DISTAL FEMUR FRACTURE;  Surgeon: Roby Lofts, MD;  Location: [facility] OR;  Service: Orthopedics;  Laterality: Right;   ROTATOR CUFF REPAIR       OB History   No obstetric history on file.     Family History  Problem Relation Age of Onset   Hypertension Mother    Hypertension Father    Hypertension Sister     Social History   Tobacco Use   Smoking status: Never Smoker   Smokeless tobacco: Never Used  Building services engineer Use: Never used  Substance Use Topics    Alcohol use: Not Currently   Drug use: Not Currently    Comment: not since college    Home Medications Prior to Admission medications   Medication Sig Start Date End Date Taking? Authorizing Provider  Cholecalciferol (VITAMIN D) 50 MCG (2000 UT) CAPS Take 1 capsule (2,000 Units total) by mouth daily. 03/24/20   Despina Hidden, PA-C  Multiple Vitamins-Minerals (MULTIVITAMIN WITH MINERALS) tablet Take 1 tablet by mouth daily.    [provider]    Allergies    Cheese and Peanut-containing drug products  Review of Systems   Review of Systems  Constitutional: Negative.   HENT: Negative.   Respiratory: Negative.   Cardiovascular: Negative.   Genitourinary: Negative.   Musculoskeletal: Positive for arthralgias (Right knee pain, chronic).  Neurological: Negative.     Physical Exam Updated Vital Signs BP (!) 162/93    Pulse 66    Temp 98.1 F (36.7 C) (Oral)    Resp 20    Ht 5\' 7"  (1.702 m)    Wt 44.9 kg    SpO2 98%    BMI 15.51 kg/m   Physical Exam Constitutional:      General: She is not in acute distress.    Appearance: She is not ill-appearing.  HENT:     Right Ear: Tympanic membrane  normal.     Left Ear: Tympanic membrane normal.     Nose: Nose normal.     Mouth/Throat:     Mouth: Mucous membranes are moist.     Pharynx: Oropharynx is clear.  Eyes:     General: No scleral icterus.    Extraocular Movements: Extraocular movements intact.     Conjunctiva/sclera: Conjunctivae normal.  Cardiovascular:     Rate and Rhythm: Normal rate and regular rhythm.     Pulses: Normal pulses.     Heart sounds: Normal heart sounds.  Pulmonary:     Effort: Pulmonary effort is normal.     Breath sounds: Normal breath sounds.  Abdominal:     General: Bowel sounds are normal.  Musculoskeletal:        General: Deformity (to right knee, chronic, unchanged) present.     Cervical back: Normal range of motion.  Lymphadenopathy:     Cervical: Cervical adenopathy present.   Skin:    General: Skin is warm and dry.     Capillary Refill: Capillary refill takes less than 2 seconds.  Neurological:     General: No focal deficit present.     Mental Status: She is alert.     Cranial Nerves: No cranial nerve deficit.     Comments: Patient is slow to follow commands, occasionally slurs speech or forgets what she is talking about; is otherwise normal    ED Results / Procedures / Treatments   Labs (all labs ordered are listed, but only abnormal results are displayed) Labs Reviewed  URINALYSIS, ROUTINE W REFLEX MICROSCOPIC - Abnormal; Notable for the following components:      Result Value   Hgb urine dipstick MODERATE (*)    Protein, ur 30 (*)    All other components within normal limits  BASIC METABOLIC PANEL - Abnormal; Notable for the following components:   Potassium 3.2 (*)    BUN 7 (*)    All other components within normal limits  CBC - Abnormal; Notable for the following components:   RBC 3.51 (*)    Hemoglobin 11.0 (*)    HCT 32.5 (*)    All other components within normal limits  RAPID URINE DRUG SCREEN, HOSP PERFORMED - Abnormal; Notable for the following components:   Tetrahydrocannabinol POSITIVE (*)    Barbiturates POSITIVE (*)    All other components within normal limits  CBG MONITORING, ED - Abnormal; Notable for the following components:   Glucose-Capillary 111 (*)    All other components within normal limits  URINE CULTURE  ETHANOL    EKG None  Radiology CT Head Wo Contrast  Result Date: 12/10/2020 CLINICAL DATA:  Mental status change. EXAM: CT HEAD WITHOUT CONTRAST TECHNIQUE: Contiguous axial images were obtained from the base of the skull through the vertex without intravenous contrast. COMPARISON:  CT head 10/18/2017 FINDINGS: Brain: Patchy and confluent areas of decreased attenuation are noted throughout the deep and periventricular white matter of the cerebral hemispheres bilaterally, compatible with chronic microvascular ischemic  disease. No evidence of large-territorial acute infarction. No parenchymal hemorrhage. No mass lesion. No extra-axial collection. No mass effect or midline shift. No hydrocephalus. Basilar cisterns are patent. Vascular: No hyperdense vessel. Skull: No acute fracture or focal lesion. Sinuses/Orbits: Paranasal sinuses and mastoid air cells are clear. The orbits are unremarkable. Other: None. IMPRESSION: No acute intracranial abnormality. Electronically Signed   By: Tish Frederickson M.D.   On: 12/10/2020 20:02    Procedures Procedures  Medications Ordered in ED  Medications - No data to display  ED Course  I have reviewed the triage vital signs and the nursing notes.  Pertinent labs & imaging results that were available during my care of the patient were reviewed by me and considered in my medical decision making (see chart for details).  Altered mental status: Patient with altered mentation and ataxic gait per police at local store.  Patient is has slow mentation and occasional slurred speech.  No other focal neurological deficits, patient is a and O x3.  Stable.  Alcohol level negative, head CT negative for acute process.  Patient's urine drug screen positive for barbiturates and marijuana.  Patient admits to smoking marijuana, however denies using barbiturates.  Patient also denies taking any other medications, denies use of other illicit substances.  Upon chart review patient has had marijuana and barbiturates on UDS in the past. -Patient vital signs stable -Patient educated on potential harms of marijuana and barbiturates -Patient recommended not to use any substances did not drive when using these substances -Patient feels she is safe to go home, will discharge home with close outpatient follow-up -Strict return precautions given   MDM Rules/Calculators/A&P                          Final Clinical Impression(s) / ED Diagnoses Final diagnoses:  Altered mental status, unspecified altered  mental status type    Rx / DC Orders ED Discharge Orders    None       Dollene Cleveland, DO 12/10/20 2302    Gerhard Munch, MD 12/11/20 [phone]

## 2020-12-10 NOTE — Discharge Instructions (Addendum)
You were evaluated in the emergency department because a police officer was concerned that your altered, confused, and/or unsteady on your feet.  Your head CT scan was negative for any acute abnormality; however, your urine drug screen revealed marijuana and barbiturates in your blood.  Barbiturates can make you tired and confused.  I strongly recommend you do not take any medications/substances that are not prescribed for you as these can be harmful and even potentially be life ending.  Please call your primary care doctor to schedule a follow-up appointment within the next week.

## 2020-12-10 NOTE — ED Triage Notes (Signed)
Patient was pick up at the [employer] with c/o UTI three days ago. BP-188/100 P-75 per ems

## 2020-12-12 LAB — URINE CULTURE: Culture: NO GROWTH

## 2021-12-28 IMAGING — CT CT HEAD W/O CM
3 of 8 series · 14 of 47 positions shown, 17 images · non-contrast
Comparison: CT head 10/18/2017

CLINICAL DATA: Mental status change.

EXAM:
CT HEAD WITHOUT CONTRAST
TECHNIQUE: Contiguous axial images were obtained from the base of the skull
through the vertex without intravenous contrast.

[Series 5: coronal soft tissue · coronal · 0.33mm/px · 3 of 72 slices shown]
[im 18/72  brain]
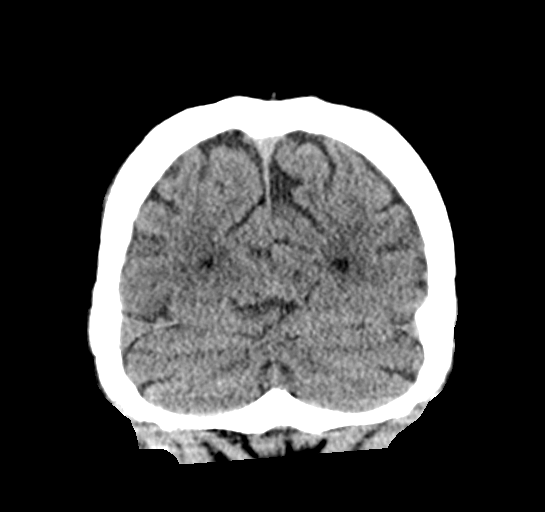
[im 36/72  brain]
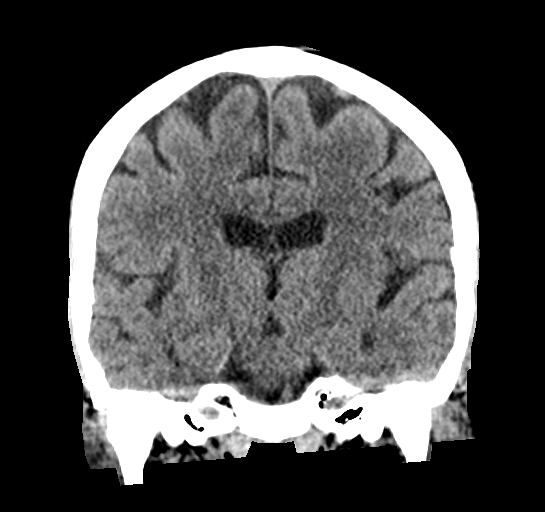
[im 54/72  brain]
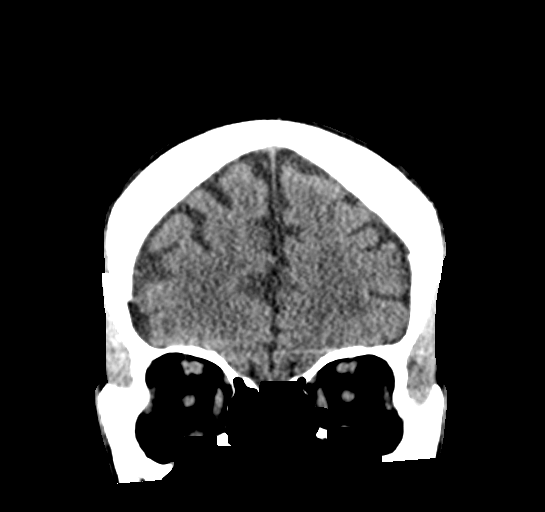

[Series 11: sagittal soft tissue · sagittal · 0.19mm/px · 2 of 58 slices shown]
[im 20/58  brain]
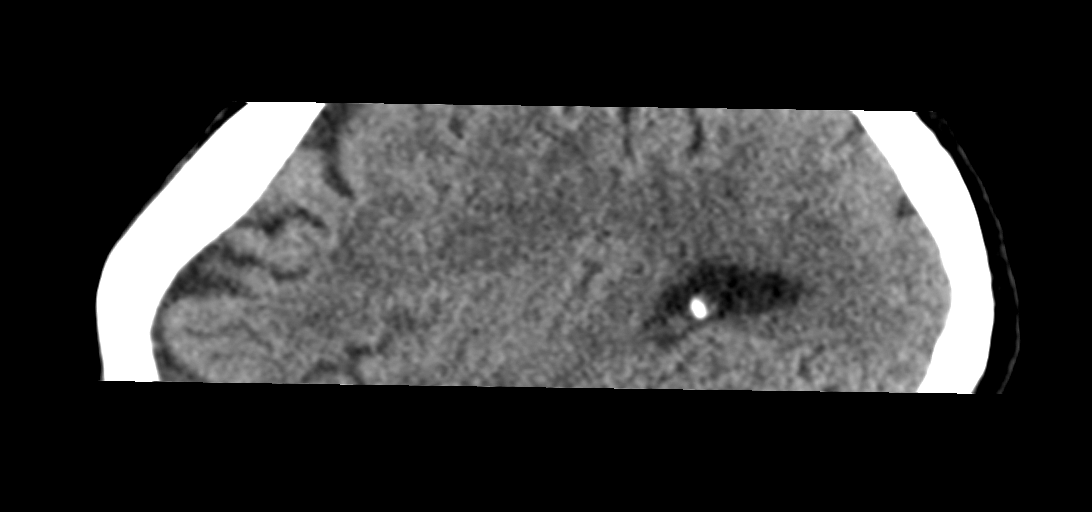
[im 39/58  brain]
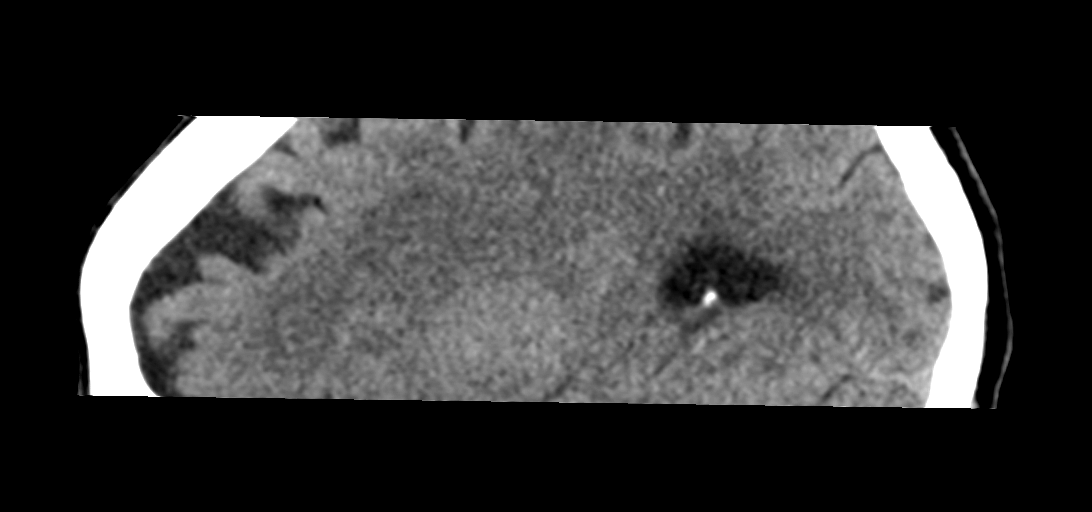

[Series 12: axial recon · axial · 0.35mm/px · z∈[+1605,+1732]mm · 9 of 52 slices shown, 12 images]
[im 4/52  brain]
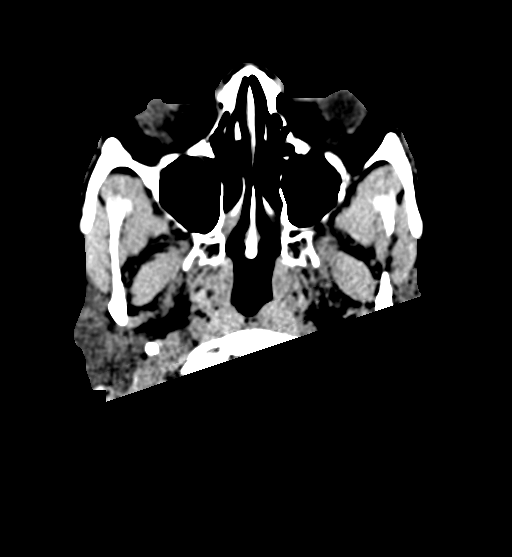
[im 4/52  bone]
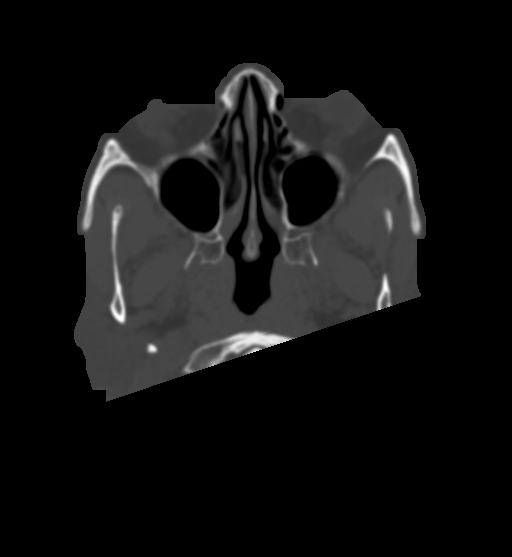
[im 12/52  brain]
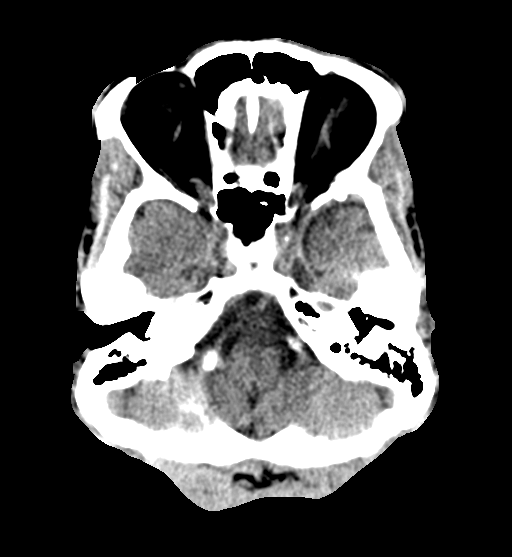
[im 16/52  brain]
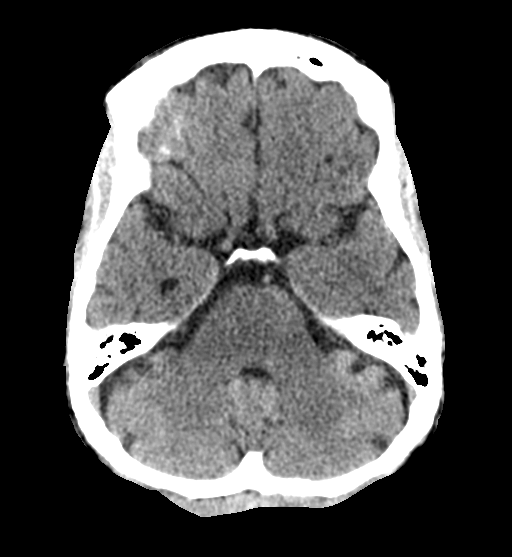
[im 20/52  brain]
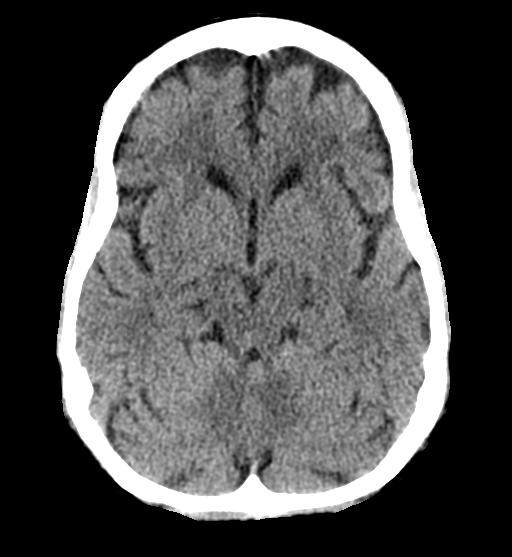
[im 28/52  brain]
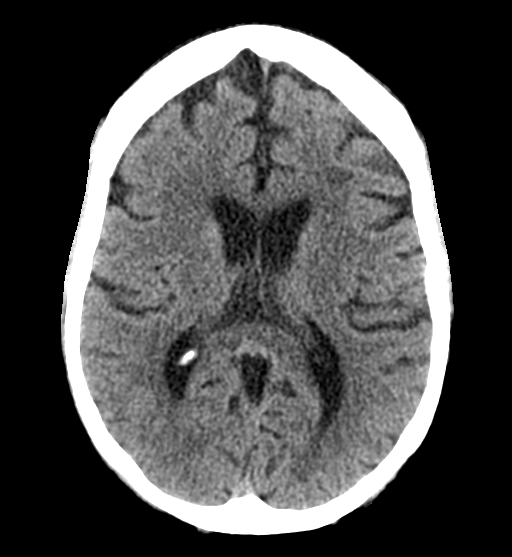
[im 28/52  bone]
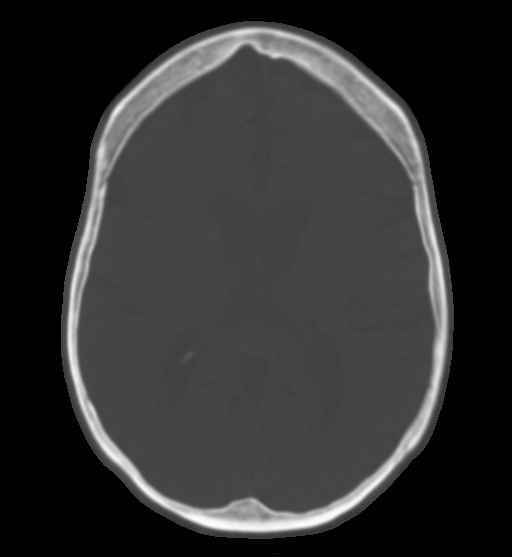
[im 32/52  brain]
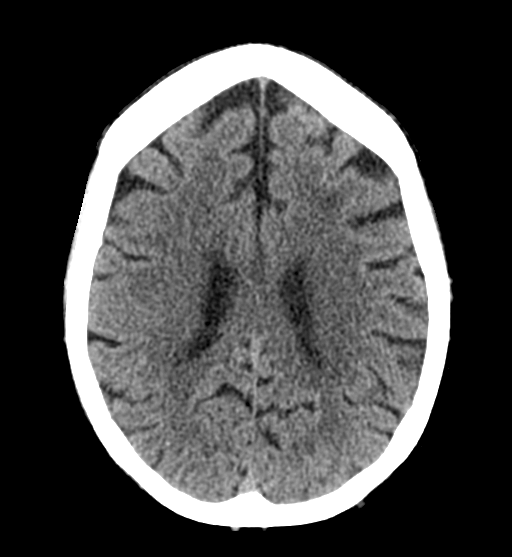
[im 36/52  brain]
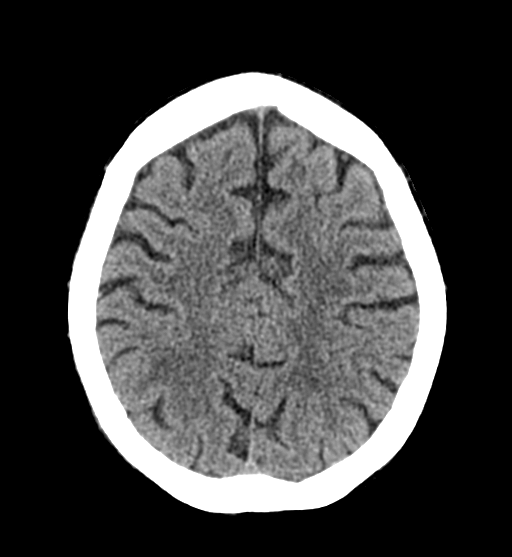
[im 44/52  brain]
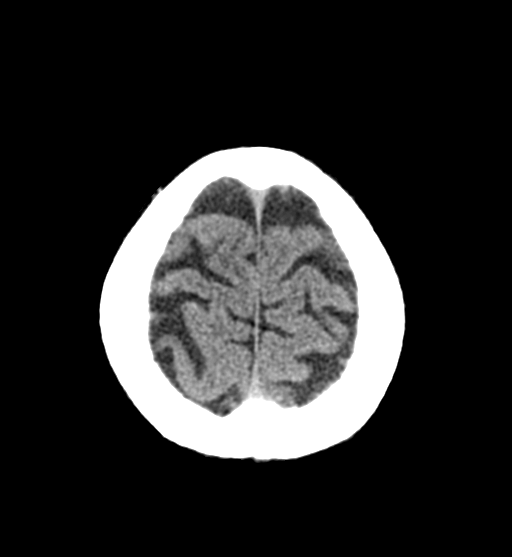
[im 48/52  brain]
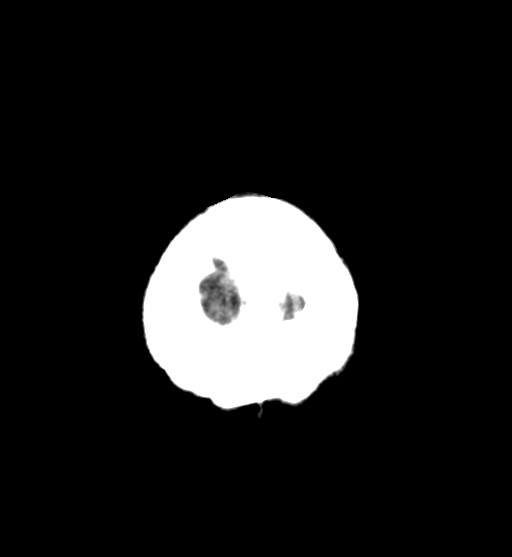
[im 48/52  bone]
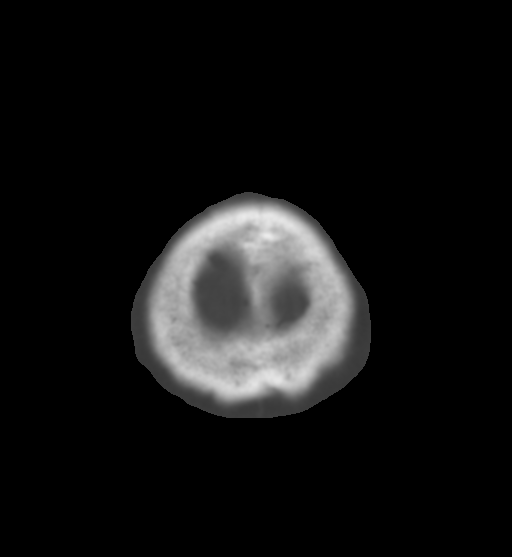

[14 of 47 positions shown; findings below may reference images not displayed]

FINDINGS: Brain:

Patchy and confluent areas of decreased attenuation are noted
throughout the deep and periventricular white matter of the cerebral
hemispheres bilaterally, compatible with chronic microvascular
ischemic disease.

No evidence of large-territorial acute infarction. No parenchymal
hemorrhage. No mass lesion. No extra-axial collection.

No mass effect or midline shift. No hydrocephalus. Basilar cisterns
are patent.

Vascular: No hyperdense vessel.

Skull: No acute fracture or focal lesion.

Sinuses/Orbits: Paranasal sinuses and mastoid air cells are clear.
The orbits are unremarkable.

Other: None.
IMPRESSION: No acute intracranial abnormality.

## 2022-04-17 ENCOUNTER — Other Ambulatory Visit: Payer: Self-pay | Admitting: Internal Medicine

## 2022-04-17 DIAGNOSIS — R42 Dizziness and giddiness: Secondary | ICD-10-CM

## 2022-05-14 ENCOUNTER — Ambulatory Visit
Admission: RE | Admit: 2022-05-14 | Discharge: 2022-05-14 | Disposition: A | Discharge status: Home / Self Care | Payer: Medicare Other | Source: Ambulatory Visit | Attending: Internal Medicine | Admitting: Internal Medicine

## 2022-05-14 DIAGNOSIS — R42 Dizziness and giddiness: Secondary | ICD-10-CM

## 2023-03-06 ENCOUNTER — Ambulatory Visit
Admission: EM | Admit: 2023-03-06 | Discharge: 2023-03-06 | Disposition: A | Discharge status: Home / Self Care | Payer: Medicare Other | Attending: Emergency Medicine | Admitting: Emergency Medicine

## 2023-03-06 DIAGNOSIS — R1032 Left lower quadrant pain: Secondary | ICD-10-CM

## 2023-03-06 MED ORDER — PREDNISONE 20 MG PO TABS: 40.0000 mg | ORAL_TABLET | Freq: Every day | ORAL | 0 refills | Status: AC

## 2023-03-06 MED ORDER — METHYLPREDNISOLONE ACETATE 80 MG/ML IJ SUSP
60.0000 mg | Freq: Once | INTRAMUSCULAR | Status: AC
  Administered 2023-03-06: 60 mg via INTRAMUSCULAR

## 2023-03-06 NOTE — Discharge Instructions (Addendum)
Today you were evaluated for your left groin pain, based on examination I believe your pain today to be muscular and we will work to help calm this down  You have been given an injection of methylprednisolone which is a steroid, steroids help to reduce inflammation which in turn helps calm pain, ideally you will start to see some form of relief in about 30 minutes to an hour  Starting tomorrow take prednisone tablets every morning with food for 5 days to continue the above process, common side effects of this medicine are increased hunger and grouchiness, if you experience this it will resolve after medicine is complete, you will not be taking the medication long enough to experience significant weight gain  You may use ice or heat over the affected area in 10 to 15-minute intervals  When sitting and lying you may prop pillow underneath the buttocks and the knee for additional comfort and support  As pain lessens begin to massage and stretch throughout the day for further comfort and support  If your pain continues to persist past use of medication please follow-up with orthopedics who are the specialist, information is listed on front page

## 2023-03-06 NOTE — ED Triage Notes (Signed)
Pt c/o lt upper leg pain for over 3 wks. Denies injury. States feels like her leg will give out and has to use a walker. States taking tylenol without relief.

## 2023-03-06 NOTE — ED Provider Notes (Signed)
[facility]    CSN: [id number] Arrival date & time: 03/06/23  1551      History   Chief Complaint Chief Complaint  Patient presents with   Leg Pain    HPI Sandra Warren is a 67 y.o. female.   Patient presents for evaluation of left-sided groin pain present for 3 weeks.  Pain has been constant, rated a 10 out of 10, described sharp, shooting throbbing pain.  Symptoms began after a long period of walking on concrete when she lost her car in a parking garage.  Pain is lessened when legs are straight and when sitting still, exacerbated when bearing weight.  Has attempted use of Tylenol which has been ineffective.  Using walker for stability, independent at baseline.  Denies numbness or tingling.  Past Medical History:  Diagnosis Date   Bipolar disorder (HCC)    Femur fracture, right (HCC)    distal   Migraine    Wears glasses     Patient Active Problem List   Diagnosis Date Noted   Altered mental status 12/10/2020   Closed fracture of right distal femur (HCC) 03/23/2020   Closed displaced supracondylar fracture with intracondylar extension of lower end of right femur (HCC) 03/22/2020    Past Surgical History:  Procedure Laterality Date   ABDOMINAL HYSTERECTOMY     COLONOSCOPY     ORIF FEMUR FRACTURE Right 03/23/2020   Procedure: OPEN REDUCTION INTERNAL FIXATION (ORIF) DISTAL FEMUR FRACTURE;  Surgeon: Roby Lofts, MD;  Location: [facility] OR;  Service: Orthopedics;  Laterality: Right;   ROTATOR CUFF REPAIR      OB History   No obstetric history on file.      Home Medications    Prior to Admission medications   Medication Sig Start Date End Date Taking? Authorizing Provider  Cholecalciferol (VITAMIN D) 50 MCG (2000 UT) CAPS Take 1 capsule (2,000 Units total) by mouth daily. 03/24/20   West Bali, PA-C  Multiple Vitamins-Minerals (MULTIVITAMIN WITH MINERALS) tablet Take 1 tablet by mouth daily.    [provider]    Family History Family  History  Problem Relation Age of Onset   Hypertension Mother    Hypertension Father    Hypertension Sister     Social History Social History   Tobacco Use   Smoking status: Never   Smokeless tobacco: Never  Vaping Use   Vaping Use: Never used  Substance Use Topics   Alcohol use: Not Currently   Drug use: Not Currently    Comment: not since college     Allergies   Cheese and Peanut-containing drug products   Review of Systems Review of Systems   Physical Exam Triage Vital Signs ED Triage Vitals [03/06/23 1605]  Enc Vitals Group     BP (!) 129/92     Pulse Rate 82     Resp 18     Temp 98.2 F (36.8 C)     Temp Source Oral     SpO2 92 %     Weight      Height      Head Circumference      Peak Flow      Pain Score 10     Pain Loc      Pain Edu?      Excl. in GC?    No data found.  Updated Vital Signs BP (!) 129/92 (BP Location: Left Arm)   Pulse 82   Temp 98.2 F (36.8 C) (Oral)  Resp 18   SpO2 92%   Visual Acuity Right Eye Distance:   Left Eye Distance:   Bilateral Distance:    Right Eye Near:   Left Eye Near:    Bilateral Near:     Physical Exam Constitutional:      Appearance: Normal appearance.  Eyes:     Extraocular Movements: Extraocular movements intact.  Pulmonary:     Effort: Pulmonary effort is normal.  Musculoskeletal:     Comments: Tenderness present in the left groin without ecchymosis swelling or deformity, difficulty bearing weight to the lower extremity, able to complete range of motion left hip pain is elicited with abduction and adduction, 2+ femoral pulse  Neurological:     Mental Status: She is alert and oriented to person, place, and time. Mental status is at baseline.      UC Treatments / Results  Labs (all labs ordered are listed, but only abnormal results are displayed) Labs Reviewed - No data to display  EKG   Radiology No results found.  Procedures Procedures (including critical care  time)  Medications Ordered in UC Medications - No data to display  Initial Impression / Assessment and Plan / UC Course  I have reviewed the triage vital signs and the nursing notes.  Pertinent labs & imaging results that were available during my care of the patient were reviewed by me and considered in my medical decision making (see chart for details).  Left Groin pain  Etiology appears to be muscular, declined Toradol injection, given methylprednisolone injection, prescribed prednisone burst for outpatient use, recommended continued use of walker until pain resolves, recommended RICE, heat massage stretching and activity as tolerated, given walker referral to orthopedics if symptoms persist or worsen Final Clinical Impressions(s) / UC Diagnoses   Final diagnoses:  None   Discharge Instructions   None    ED Prescriptions   None    PDMP not reviewed this encounter.   Valinda Hoar, NP 03/06/23 1702

## 2023-09-12 ENCOUNTER — Ambulatory Visit
Admission: EM | Admit: 2023-09-12 | Discharge: 2023-09-12 | Disposition: A | Discharge status: Home / Self Care | Payer: Medicare Other | Attending: Emergency Medicine | Admitting: Emergency Medicine

## 2023-09-12 ENCOUNTER — Encounter (HOSPITAL_COMMUNITY): Payer: Self-pay

## 2023-09-12 ENCOUNTER — Emergency Department (HOSPITAL_COMMUNITY)
Admission: EM | Admit: 2023-09-12 | Discharge: 2023-09-12 | Disposition: A | Discharge status: Home / Self Care | Payer: Medicare Other | Attending: Emergency Medicine | Admitting: Emergency Medicine

## 2023-09-12 ENCOUNTER — Other Ambulatory Visit: Payer: Self-pay

## 2023-09-12 DIAGNOSIS — W228XXA Striking against or struck by other objects, initial encounter: Secondary | ICD-10-CM | POA: Diagnosis not present

## 2023-09-12 DIAGNOSIS — I10 Essential (primary) hypertension: Secondary | ICD-10-CM | POA: Diagnosis not present

## 2023-09-12 DIAGNOSIS — R29898 Other symptoms and signs involving the musculoskeletal system: Secondary | ICD-10-CM | POA: Diagnosis not present

## 2023-09-12 DIAGNOSIS — S6401XA Injury of ulnar nerve at wrist and hand level of right arm, initial encounter: Secondary | ICD-10-CM | POA: Diagnosis not present

## 2023-09-12 DIAGNOSIS — S6991XA Unspecified injury of right wrist, hand and finger(s), initial encounter: Secondary | ICD-10-CM | POA: Diagnosis present

## 2023-09-12 DIAGNOSIS — Z91010 Allergy to peanuts: Secondary | ICD-10-CM | POA: Insufficient documentation

## 2023-09-12 DIAGNOSIS — Z79899 Other long term (current) drug therapy: Secondary | ICD-10-CM | POA: Diagnosis not present

## 2023-09-12 MED ORDER — AMLODIPINE BESYLATE 2.5 MG PO TABS: 2.5000 mg | ORAL_TABLET | Freq: Every day | ORAL | 1 refills | Status: AC

## 2023-09-12 MED ORDER — AMLODIPINE BESYLATE 5 MG PO TABS
2.5000 mg | ORAL_TABLET | Freq: Once | ORAL | Status: AC
  Administered 2023-09-12: 2.5 mg via ORAL
  Filled 2023-09-12: qty 1

## 2023-09-12 NOTE — ED Provider Notes (Signed)
[facility] [facility] AT [facility] Provider Note   CSN: [id number] Arrival date & time: 09/12/23  1628     History  Chief Complaint  Patient presents with   Hypertension    Sandra Warren is a 67 y.o. female.  Patient is a 67 year old female with a past medical history of bipolar disorder presenting to the emergency department with a hand injury as well as high blood pressure.  The patient states that on Monday she hit her hand against a dresser and since then she has not been able to move all her fingers normally.  She states that she is having difficulty opening jars and dropping heavy bags.  She states only mild discomfort in her hand but denies any significant pain.  She denies any numbness.  She states that she was evaluated at urgent care who recommended that she come to the ED for evaluation because they were concerned that her blood pressure was very high.  She states that she does not have a known history of hypertension but was told at her last primary doctor's visit that it was above normal.  The history is provided by the patient.  Hypertension       Home Medications Prior to Admission medications   Medication Sig Start Date End Date Taking? Authorizing Provider  amLODipine (NORVASC) 2.5 MG tablet Take 1 tablet (2.5 mg total) by mouth daily. 09/12/23  Yes Elayne Snare K, DO  Cholecalciferol (VITAMIN D) 50 MCG (2000 UT) CAPS Take 1 capsule (2,000 Units total) by mouth daily. 03/24/20   West Bali, PA-C  Multiple Vitamins-Minerals (MULTIVITAMIN WITH MINERALS) tablet Take 1 tablet by mouth daily.    [provider]  predniSONE (DELTASONE) 20 MG tablet Take 2 tablets (40 mg total) by mouth daily. 03/06/23   Valinda Hoar, NP      Allergies    Cheese, Peanut-containing drug products, and Sulfa antibiotics    Review of Systems   Review of Systems  Physical Exam Updated Vital Signs BP (!) 160/111 (BP Location: Left Arm)    Pulse 67   Temp 98.7 F (37.1 C) (Oral)   Resp 16   Ht 5\' 6"  (1.676 m)   Wt 43.1 kg   SpO2 99%   BMI 15.34 kg/m  Physical Exam Vitals and nursing note reviewed.  Constitutional:      General: She is not in acute distress.    Appearance: Normal appearance.  HENT:     Head: Normocephalic and atraumatic.     Nose: Nose normal.     Mouth/Throat:     Mouth: Mucous membranes are moist.     Pharynx: Oropharynx is clear.  Eyes:     Extraocular Movements: Extraocular movements intact.     Conjunctiva/sclera: Conjunctivae normal.  Cardiovascular:     Rate and Rhythm: Normal rate and regular rhythm.     Pulses: Normal pulses.     Heart sounds: Normal heart sounds.  Pulmonary:     Effort: Pulmonary effort is normal.     Breath sounds: Normal breath sounds.  Abdominal:     General: Abdomen is flat.  Musculoskeletal:        General: Normal range of motion.     Cervical back: Normal range of motion and neck supple. No tenderness.     Comments: No tenderness to palpation of right upper extremity  Skin:    General: Skin is warm and dry.     Comments: Abrasion to dorsal  aspect of right wrist without surrounding erythema, warmth or drainage  Neurological:     Mental Status: She is alert and oriented to person, place, and time.     Comments: Sensation intact throughout right upper extremity within all dermatomes, 5 out of 5 grip strength bilaterally, wrist flexion/extension intact, limited finger abduction secondary to weakness, able to cross first and second digits  Psychiatric:        Mood and Affect: Mood normal.        Behavior: Behavior normal.     ED Results / Procedures / Treatments   Labs (all labs ordered are listed, but only abnormal results are displayed) Labs Reviewed - No data to display  EKG None  Radiology No results found.  Procedures Procedures    Medications Ordered in ED Medications  amLODipine (NORVASC) tablet 2.5 mg (has no administration in time  range)    ED Course/ Medical Decision Making/ A&P                                 Medical Decision Making This patient presents to the ED with chief complaint(s) of hand injury, HTN with pertinent past medical history of bipolar which further complicates the presenting complaint. The complaint involves an extensive differential diagnosis and also carries with it a high risk of complications and morbidity.    The differential diagnosis includes peripheral neuropathy, no bony tenderness making fracture unlikely, deficits are limited to single nerve distribution in the hand making CVA or TIA unlikely, no other signs of hypertensive emergency or urgency  Additional history obtained: Additional history obtained from family Records reviewed urgent care records  ED Course and Reassessment: On patient's arrival she is mildly hypertensive but otherwise hemodynamically stable in no acute distress.  She does have good pulses in her right wrist and does have an abrasion, no bony tenderness or signs of fracture or dislocation.  She does have weakness with finger abduction, she is able to cross her fingers and grip strength and wrist flexion and extension are intact.  Based on her exam most concern for an ulnar nerve injury.  She will be placed in ulnar gutter splint and will be given outpatient hand follow-up.  Patient will be started on an antihypertensive for her blood pressure and was recommended primary care follow-up for blood pressure recheck.  Independent labs interpretation:  N/A  Independent visualization of imaging: - N/A  Consultation: - Consulted or discussed management/test interpretation w/ external professional: N/A  Consideration for admission or further workup: Patient has no emergent conditions requiring admission or further work-up at this time and is stable for discharge home with primary care and hand follow-up  Social Determinants of health: N/A    Risk Prescription drug  management.          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

## 2023-09-12 NOTE — Progress Notes (Signed)
Orthopedic Tech Progress Note Patient Details:  Sandra Warren 1956-08-04 [id number]  Ortho Devices Type of Ortho Device: Ulna gutter splint Ortho Device/Splint Location: right Ortho Device/Splint Interventions: Ordered, Application, Adjustment   Post Interventions Patient Tolerated: Well Instructions Provided: Adjustment of device, Care of device  Kizzie Fantasia 09/12/2023, 7:12 PM

## 2023-09-12 NOTE — ED Notes (Signed)
Patient presents with right arm weakness, unable to lift or grip. Woke up Monday with this, now weakness is going up right arm. Hit arm on chest in bedroom, unsure if this caused weakness.

## 2023-09-12 NOTE — ED Provider Notes (Addendum)
[facility]    CSN: [id number] Arrival date & time: 09/12/23  1413      History   Chief Complaint Chief Complaint  Patient presents with   Arm Injury    right    HPI Sandra Warren is a 67 y.o. female.   Patient presents with concerns of right hand weakness. She states it started Monday morning. She states she hit her arm on a dresser on Sunday and isn't sure if that caused her symptoms. The patient states her hand feels uncomfortable but denies pain, and states the discomfort goes some into her forearm. She denies numbness or tingling. The patient states she can't use her hand and "it doesn't work". She has not taken or tried anything for it and denies prior similar. She is left handed. She has felt well otherwise without headache, fever, or weakness elsewhere.  The history is provided by the patient.  Arm Injury Associated symptoms: no fatigue and no fever     Past Medical History:  Diagnosis Date   Bipolar disorder (HCC)    Femur fracture, right (HCC)    distal   Migraine    Wears glasses     Patient Active Problem List   Diagnosis Date Noted   Altered mental status 12/10/2020   Closed fracture of right distal femur (HCC) 03/23/2020   Closed displaced supracondylar fracture with intracondylar extension of lower end of right femur (HCC) 03/22/2020    Past Surgical History:  Procedure Laterality Date   ABDOMINAL HYSTERECTOMY     COLONOSCOPY     ORIF FEMUR FRACTURE Right 03/23/2020   Procedure: OPEN REDUCTION INTERNAL FIXATION (ORIF) DISTAL FEMUR FRACTURE;  Surgeon: Roby Lofts, MD;  Location: [facility] OR;  Service: Orthopedics;  Laterality: Right;   ROTATOR CUFF REPAIR      OB History   No obstetric history on file.      Home Medications    Prior to Admission medications   Medication Sig Start Date End Date Taking? Authorizing Provider  Cholecalciferol (VITAMIN D) 50 MCG (2000 UT) CAPS Take 1 capsule (2,000 Units total) by mouth daily.  03/24/20   West Bali, PA-C  Multiple Vitamins-Minerals (MULTIVITAMIN WITH MINERALS) tablet Take 1 tablet by mouth daily.    [provider]  predniSONE (DELTASONE) 20 MG tablet Take 2 tablets (40 mg total) by mouth daily. 03/06/23   Valinda Hoar, NP    Family History Family History  Problem Relation Age of Onset   Hypertension Mother    Hypertension Father    Hypertension Sister     Social History Social History   Tobacco Use   Smoking status: Never   Smokeless tobacco: Never  Vaping Use   Vaping status: Never Used  Substance Use Topics   Alcohol use: Not Currently   Drug use: Not Currently    Comment: not since college     Allergies   Cheese and Peanut-containing drug products   Review of Systems Review of Systems  Constitutional:  Negative for fatigue and fever.  Musculoskeletal:  Negative for arthralgias and myalgias.  Skin:  Negative for color change, rash and wound.  Neurological:  Positive for weakness. Negative for dizziness, facial asymmetry, speech difficulty, numbness and headaches.     Physical Exam Triage Vital Signs ED Triage Vitals  Encounter Vitals Group     BP 09/12/23 1508 (!) 158/110     Systolic BP Percentile --      Diastolic BP Percentile --  Pulse Rate 09/12/23 1508 71     Resp 09/12/23 1508 16     Temp 09/12/23 1508 98 F (36.7 C)     Temp Source 09/12/23 1508 Oral     SpO2 09/12/23 1508 99 %     Weight 09/12/23 1502 95 lb (43.1 kg)     Height 09/12/23 1502 5\' 6"  (1.676 m)     Head Circumference --      Peak Flow --      Pain Score 09/12/23 1502 0     Pain Loc --      Pain Education --      Exclude from Growth Chart --    No data found.  Updated Vital Signs BP (!) 165/106 (BP Location: Right Arm)   Pulse 68   Temp 98 F (36.7 C) (Oral)   Resp 16   Ht 5\' 6"  (1.676 m)   Wt 95 lb (43.1 kg)   SpO2 95%   BMI 15.33 kg/m   Visual Acuity Right Eye Distance:   Left Eye Distance:   Bilateral Distance:     Right Eye Near:   Left Eye Near:    Bilateral Near:     Physical Exam Vitals and nursing note reviewed.  Constitutional:      General: She is not in acute distress.    Appearance: Normal appearance.  HENT:     Head: Normocephalic and atraumatic.  Eyes:     Pupils: Pupils are equal, round, and reactive to light.  Cardiovascular:     Rate and Rhythm: Normal rate and regular rhythm.     Heart sounds: Normal heart sounds.  Pulmonary:     Effort: Pulmonary effort is normal.     Breath sounds: Normal breath sounds.  Musculoskeletal:     Right hand: No swelling, tenderness or bony tenderness. Decreased range of motion. Decreased strength. Normal sensation. Normal capillary refill. Normal pulse.     Comments: No tenderness, swelling, or skin changes to right hand. Patient unable to actively fully extend fingers and decreased ability to fully flex into fist. Decrease in grip strength on right compared to left. Sensation intact. Full passive ROM of right hand without pain. No tenderness into wrist or forearm but holding wrist in a more flexed position. Full ROM wrist and elbow.  Skin:    Findings: No bruising, erythema or rash.  Neurological:     Mental Status: She is alert.      UC Treatments / Results  Labs (all labs ordered are listed, but only abnormal results are displayed) Labs Reviewed - No data to display  EKG   Radiology No results found.  Procedures Procedures (including critical care time)  Medications Ordered in UC Medications - No data to display  Initial Impression / Assessment and Plan / UC Course  I have reviewed the triage vital signs and the nursing notes.  Pertinent labs & imaging results that were available during my care of the patient were reviewed by me and considered in my medical decision making (see chart for details).    Atypical presentation with subjective hand weakness. No notable weakness to remaining arm or any facial droop or speech  changes to suggest CVA, also delayed presentation with symptoms x4 days. Unclear if patient truly unable to fully extend hand as at times appears to have more movement than other times during exam. Sensation intact. Discussed with pt recommendation to go to the ER for further evaluation given new onset weakness.  E/M:  1 undiagnosed new problem with uncertain prognosis, no data, high risk due to ER referral  Final Clinical Impressions(s) / UC Diagnoses   Final diagnoses:  Weakness of right hand     Discharge Instructions      As discussed, we cannot evaluate for all of the possible causes, some which could be serious, for your hand weakness. Recommend going to the ER for further evaluation and indicated treatment.     ED Prescriptions   None    PDMP not reviewed this encounter.   Estanislado Pandy, PA 09/12/23 1549    Estanislado Pandy, [place] 09/12/23 1549

## 2023-09-12 NOTE — Discharge Instructions (Signed)
You were seen in the emergency department for your hand injury.  You likely bruised one of the nerves in your hands which is causing some of your weakness.  We have placed you in a splint to help protect the nerve and you can follow-up with the hand surgeon for further evaluation and see if you may need some physical therapy.  Your blood pressure was high in the emergency department and I have given you prescription for blood pressure medication.  You should take this as prescribed.  You can follow-up with your primary doctor in the next few days to have a blood pressure recheck.  You should return to the emergency department if you have numbness or weakness on one full side of the body compared to the other, your fingers turn blue or if you have any other new or concerning symptoms.

## 2023-09-12 NOTE — ED Triage Notes (Signed)
Patient sent here from urgent care for elevated blood pressure. Pt does not endorse any symptoms from her elevated blood pressure. States she has not been prescribed any HTN medications. Also reports cramping to the right hand, where she cannot use her fingers that started on Monday.

## 2023-09-12 NOTE — Discharge Instructions (Signed)
As discussed, we cannot evaluate for all of the possible causes, some which could be serious, for your hand weakness. Recommend going to the ER for further evaluation and indicated treatment.

## 2023-12-11 ENCOUNTER — Other Ambulatory Visit: Payer: Self-pay | Admitting: Internal Medicine

## 2023-12-11 DIAGNOSIS — G44009 Cluster headache syndrome, unspecified, not intractable: Secondary | ICD-10-CM

## 2024-01-06 ENCOUNTER — Other Ambulatory Visit

## 2024-01-24 ENCOUNTER — Ambulatory Visit
Admission: RE | Admit: 2024-01-24 | Discharge: 2024-01-24 | Disposition: A | Discharge status: Home / Self Care | Source: Ambulatory Visit | Attending: Internal Medicine | Admitting: Internal Medicine

## 2024-01-24 DIAGNOSIS — G44009 Cluster headache syndrome, unspecified, not intractable: Secondary | ICD-10-CM
